# Patient Record
Sex: Female | Born: 1935 | Race: Black or African American | Hispanic: No | Smoking: Former smoker
Health system: Southern US, Community
[De-identification: ages and names within clinical notes are randomized; demographics above are authoritative.]

## PROBLEM LIST (undated history)

## (undated) DIAGNOSIS — D509 Iron deficiency anemia, unspecified: Secondary | ICD-10-CM

## (undated) DIAGNOSIS — N189 Chronic kidney disease, unspecified: Secondary | ICD-10-CM

## (undated) DIAGNOSIS — K269 Duodenal ulcer, unspecified as acute or chronic, without hemorrhage or perforation: Secondary | ICD-10-CM

## (undated) DIAGNOSIS — E785 Hyperlipidemia, unspecified: Secondary | ICD-10-CM

## (undated) DIAGNOSIS — M1990 Unspecified osteoarthritis, unspecified site: Secondary | ICD-10-CM

## (undated) DIAGNOSIS — G309 Alzheimer's disease, unspecified: Secondary | ICD-10-CM

## (undated) DIAGNOSIS — I10 Essential (primary) hypertension: Secondary | ICD-10-CM

## (undated) DIAGNOSIS — S2239XA Fracture of one rib, unspecified side, initial encounter for closed fracture: Secondary | ICD-10-CM

## (undated) DIAGNOSIS — F0280 Dementia in other diseases classified elsewhere without behavioral disturbance: Secondary | ICD-10-CM

## (undated) DIAGNOSIS — K219 Gastro-esophageal reflux disease without esophagitis: Secondary | ICD-10-CM

## (undated) DIAGNOSIS — K222 Esophageal obstruction: Secondary | ICD-10-CM

## (undated) DIAGNOSIS — N201 Calculus of ureter: Secondary | ICD-10-CM

## (undated) HISTORY — PX: DILATION AND CURETTAGE OF UTERUS: SHX78

## (undated) HISTORY — PX: ABDOMINAL SURGERY: SHX537

## (undated) HISTORY — PX: ORTHOPEDIC SURGERY: SHX850

---

## 2001-03-20 ENCOUNTER — Ambulatory Visit (HOSPITAL_COMMUNITY): Admission: RE | Admit: 2001-03-20 | Discharge: 2001-03-20 | Payer: Self-pay | Admitting: Internal Medicine

## 2001-03-20 ENCOUNTER — Encounter: Payer: Self-pay | Admitting: Internal Medicine

## 2002-04-03 ENCOUNTER — Ambulatory Visit (HOSPITAL_COMMUNITY): Admission: RE | Admit: 2002-04-03 | Discharge: 2002-04-03 | Payer: Self-pay | Admitting: Internal Medicine

## 2002-04-03 ENCOUNTER — Encounter: Payer: Self-pay | Admitting: Internal Medicine

## 2003-05-10 ENCOUNTER — Ambulatory Visit (HOSPITAL_COMMUNITY): Admission: RE | Admit: 2003-05-10 | Discharge: 2003-05-10 | Payer: Self-pay | Admitting: Internal Medicine

## 2003-05-10 ENCOUNTER — Encounter: Payer: Self-pay | Admitting: Internal Medicine

## 2004-07-14 ENCOUNTER — Ambulatory Visit (HOSPITAL_COMMUNITY): Admission: RE | Admit: 2004-07-14 | Discharge: 2004-07-14 | Payer: Self-pay | Admitting: Internal Medicine

## 2005-08-03 ENCOUNTER — Ambulatory Visit (HOSPITAL_COMMUNITY): Admission: RE | Admit: 2005-08-03 | Discharge: 2005-08-03 | Payer: Self-pay | Admitting: Internal Medicine

## 2006-09-10 ENCOUNTER — Ambulatory Visit (HOSPITAL_COMMUNITY): Admission: RE | Admit: 2006-09-10 | Discharge: 2006-09-10 | Payer: Self-pay | Admitting: Internal Medicine

## 2007-10-13 ENCOUNTER — Ambulatory Visit (HOSPITAL_COMMUNITY): Admission: RE | Admit: 2007-10-13 | Discharge: 2007-10-13 | Payer: Self-pay | Admitting: Internal Medicine

## 2008-12-31 ENCOUNTER — Ambulatory Visit (HOSPITAL_COMMUNITY): Admission: RE | Admit: 2008-12-31 | Discharge: 2008-12-31 | Payer: Self-pay | Admitting: Internal Medicine

## 2009-12-19 ENCOUNTER — Ambulatory Visit (HOSPITAL_COMMUNITY): Admission: RE | Admit: 2009-12-19 | Discharge: 2009-12-19 | Payer: Self-pay | Admitting: Internal Medicine

## 2010-10-19 ENCOUNTER — Ambulatory Visit (HOSPITAL_COMMUNITY): Admission: RE | Admit: 2010-10-19 | Discharge: 2010-10-19 | Payer: Self-pay | Admitting: Internal Medicine

## 2012-01-20 ENCOUNTER — Encounter (HOSPITAL_COMMUNITY): Payer: Self-pay | Admitting: *Deleted

## 2012-01-20 ENCOUNTER — Other Ambulatory Visit: Payer: Self-pay

## 2012-01-20 ENCOUNTER — Emergency Department (HOSPITAL_COMMUNITY): Payer: Medicare Other

## 2012-01-20 ENCOUNTER — Inpatient Hospital Stay (HOSPITAL_COMMUNITY)
Admission: EM | Admit: 2012-01-20 | Discharge: 2012-01-23 | DRG: 690 | Disposition: A | Discharge status: Home Health | Payer: Medicare Other | Attending: Internal Medicine | Admitting: Internal Medicine

## 2012-01-20 DIAGNOSIS — Z9181 History of falling: Secondary | ICD-10-CM

## 2012-01-20 DIAGNOSIS — N390 Urinary tract infection, site not specified: Secondary | ICD-10-CM | POA: Diagnosis not present

## 2012-01-20 DIAGNOSIS — G309 Alzheimer's disease, unspecified: Secondary | ICD-10-CM | POA: Diagnosis present

## 2012-01-20 DIAGNOSIS — E7800 Pure hypercholesterolemia, unspecified: Secondary | ICD-10-CM | POA: Diagnosis present

## 2012-01-20 DIAGNOSIS — A498 Other bacterial infections of unspecified site: Secondary | ICD-10-CM | POA: Diagnosis present

## 2012-01-20 DIAGNOSIS — N289 Disorder of kidney and ureter, unspecified: Secondary | ICD-10-CM | POA: Diagnosis present

## 2012-01-20 DIAGNOSIS — H5316 Psychophysical visual disturbances: Secondary | ICD-10-CM | POA: Diagnosis not present

## 2012-01-20 DIAGNOSIS — F0280 Dementia in other diseases classified elsewhere without behavioral disturbance: Secondary | ICD-10-CM | POA: Diagnosis present

## 2012-01-20 DIAGNOSIS — Z79899 Other long term (current) drug therapy: Secondary | ICD-10-CM | POA: Diagnosis not present

## 2012-01-20 DIAGNOSIS — M1710 Unilateral primary osteoarthritis, unspecified knee: Secondary | ICD-10-CM | POA: Diagnosis present

## 2012-01-20 DIAGNOSIS — R410 Disorientation, unspecified: Secondary | ICD-10-CM

## 2012-01-20 DIAGNOSIS — IMO0002 Reserved for concepts with insufficient information to code with codable children: Secondary | ICD-10-CM | POA: Diagnosis present

## 2012-01-20 DIAGNOSIS — D649 Anemia, unspecified: Secondary | ICD-10-CM | POA: Diagnosis present

## 2012-01-20 DIAGNOSIS — I440 Atrioventricular block, first degree: Secondary | ICD-10-CM | POA: Diagnosis present

## 2012-01-20 DIAGNOSIS — W19XXXA Unspecified fall, initial encounter: Secondary | ICD-10-CM

## 2012-01-20 DIAGNOSIS — Y92009 Unspecified place in unspecified non-institutional (private) residence as the place of occurrence of the external cause: Secondary | ICD-10-CM

## 2012-01-20 DIAGNOSIS — E876 Hypokalemia: Secondary | ICD-10-CM | POA: Diagnosis present

## 2012-01-20 DIAGNOSIS — F17200 Nicotine dependence, unspecified, uncomplicated: Secondary | ICD-10-CM | POA: Diagnosis present

## 2012-01-20 DIAGNOSIS — I498 Other specified cardiac arrhythmias: Secondary | ICD-10-CM | POA: Diagnosis present

## 2012-01-20 DIAGNOSIS — I10 Essential (primary) hypertension: Secondary | ICD-10-CM | POA: Diagnosis not present

## 2012-01-20 DIAGNOSIS — M25869 Other specified joint disorders, unspecified knee: Secondary | ICD-10-CM | POA: Diagnosis not present

## 2012-01-20 DIAGNOSIS — Z8673 Personal history of transient ischemic attack (TIA), and cerebral infarction without residual deficits: Secondary | ICD-10-CM

## 2012-01-20 DIAGNOSIS — R404 Transient alteration of awareness: Secondary | ICD-10-CM | POA: Diagnosis present

## 2012-01-20 DIAGNOSIS — E860 Dehydration: Secondary | ICD-10-CM | POA: Diagnosis present

## 2012-01-20 DIAGNOSIS — M6282 Rhabdomyolysis: Secondary | ICD-10-CM | POA: Diagnosis present

## 2012-01-20 DIAGNOSIS — X58XXXA Exposure to other specified factors, initial encounter: Secondary | ICD-10-CM

## 2012-01-20 DIAGNOSIS — R4182 Altered mental status, unspecified: Secondary | ICD-10-CM | POA: Diagnosis not present

## 2012-01-20 DIAGNOSIS — M25569 Pain in unspecified knee: Secondary | ICD-10-CM | POA: Diagnosis not present

## 2012-01-20 LAB — CBC
HCT: 32.5 % — ABNORMAL LOW (ref 36.0–46.0)
Hemoglobin: 11 g/dL — ABNORMAL LOW (ref 12.0–15.0)
MCH: 28.1 pg (ref 26.0–34.0)
MCHC: 33.8 g/dL (ref 30.0–36.0)
MCV: 83.1 fL (ref 78.0–100.0)
Platelets: 294 K/uL (ref 150–400)
RBC: 3.91 MIL/uL (ref 3.87–5.11)
RDW: 13.1 % (ref 11.5–15.5)
WBC: 10.3 10*3/uL (ref 4.0–10.5)

## 2012-01-20 LAB — URINALYSIS, ROUTINE W REFLEX MICROSCOPIC
Bilirubin Urine: NEGATIVE
Glucose, UA: NEGATIVE mg/dL
Ketones, ur: NEGATIVE mg/dL
Nitrite: POSITIVE — AB
Protein, ur: NEGATIVE mg/dL
Specific Gravity, Urine: 1.02 (ref 1.005–1.030)
Urobilinogen, UA: 0.2 mg/dL (ref 0.0–1.0)
pH: 6 (ref 5.0–8.0)

## 2012-01-20 LAB — URINE MICROSCOPIC-ADD ON

## 2012-01-20 LAB — BASIC METABOLIC PANEL
BUN: 18 mg/dL (ref 6–23)
CO2: 29 mEq/L (ref 19–32)
Chloride: 97 mEq/L (ref 96–112)
GFR calc non Af Amer: 41 mL/min — ABNORMAL LOW (ref >90)
Glucose, Bld: 112 mg/dL — ABNORMAL HIGH (ref 70–99)

## 2012-01-20 LAB — BASIC METABOLIC PANEL WITH GFR
Calcium: 10.4 mg/dL (ref 8.4–10.5)
Creatinine, Ser: 1.2 mg/dL — ABNORMAL HIGH (ref 0.50–1.10)
GFR calc Af Amer: 48 mL/min — ABNORMAL LOW (ref >90)
Potassium: 3.1 meq/L — ABNORMAL LOW (ref 3.5–5.1)
Sodium: 135 meq/L (ref 135–145)

## 2012-01-20 LAB — CK: Total CK: 1789 U/L — ABNORMAL HIGH (ref 7–177)

## 2012-01-20 LAB — TROPONIN I: Troponin I: <0.3 ng/mL (ref <0.30)

## 2012-01-20 MED ORDER — POTASSIUM CHLORIDE CRYS ER 20 MEQ PO TBCR
40.0000 meq | EXTENDED_RELEASE_TABLET | Freq: Once | ORAL | Status: AC
  Administered 2012-01-20: 40 meq via ORAL
  Filled 2012-01-20: qty 2

## 2012-01-20 MED ORDER — SODIUM CHLORIDE 0.9 % IV BOLUS (SEPSIS)
500.0000 mL | Freq: Once | INTRAVENOUS | Status: AC
  Administered 2012-01-20: 500 mL via INTRAVENOUS

## 2012-01-20 MED ORDER — DILTIAZEM HCL ER BEADS 300 MG PO CP24
300.0000 mg | ORAL_CAPSULE | Freq: Every day | ORAL | Status: DC
  Administered 2012-01-20 – 2012-01-23 (×4): 300 mg via ORAL
  Filled 2012-01-20 (×7): qty 1

## 2012-01-20 MED ORDER — HYDROCHLOROTHIAZIDE 25 MG PO TABS
25.0000 mg | ORAL_TABLET | Freq: Every day | ORAL | Status: DC
  Administered 2012-01-20: 25 mg via ORAL
  Filled 2012-01-20: qty 1

## 2012-01-20 MED ORDER — ATENOLOL 25 MG PO TABS
50.0000 mg | ORAL_TABLET | Freq: Every day | ORAL | Status: DC
  Administered 2012-01-20 – 2012-01-23 (×3): 50 mg via ORAL
  Filled 2012-01-20 (×3): qty 2
  Filled 2012-01-20 (×2): qty 1

## 2012-01-20 MED ORDER — SODIUM CHLORIDE 0.9 % IV SOLN
Freq: Once | INTRAVENOUS | Status: AC
  Administered 2012-01-20: 15:00:00 via INTRAVENOUS

## 2012-01-20 MED ORDER — SODIUM CHLORIDE 0.9 % IV SOLN
INTRAVENOUS | Status: DC
  Administered 2012-01-20: 21:00:00 via INTRAVENOUS

## 2012-01-20 MED ORDER — LOSARTAN POTASSIUM-HCTZ 100-25 MG PO TABS: 1.0000 | ORAL_TABLET | Freq: Every day | ORAL | Status: DC

## 2012-01-20 MED ORDER — POTASSIUM CHLORIDE CRYS ER 20 MEQ PO TBCR: 20.0000 meq | EXTENDED_RELEASE_TABLET | Freq: Every day | ORAL | Status: DC

## 2012-01-20 MED ORDER — SIMVASTATIN 20 MG PO TABS
20.0000 mg | ORAL_TABLET | Freq: Every day | ORAL | Status: DC
  Administered 2012-01-20 – 2012-01-23 (×4): 20 mg via ORAL
  Filled 2012-01-20 (×7): qty 1

## 2012-01-20 MED ORDER — CIPROFLOXACIN IN D5W 400 MG/200ML IV SOLN
400.0000 mg | Freq: Two times a day (BID) | INTRAVENOUS | Status: DC
  Administered 2012-01-21 – 2012-01-22 (×5): 400 mg via INTRAVENOUS
  Filled 2012-01-20 (×8): qty 200

## 2012-01-20 MED ORDER — SODIUM CHLORIDE 0.9 % IV SOLN
INTRAVENOUS | Status: DC
  Administered 2012-01-21 – 2012-01-22 (×2): via INTRAVENOUS

## 2012-01-20 MED ORDER — ACETAMINOPHEN 500 MG PO TABS: 500.0000 mg | ORAL_TABLET | ORAL | Status: DC | PRN

## 2012-01-20 MED ORDER — POTASSIUM CHLORIDE CRYS ER 20 MEQ PO TBCR
20.0000 meq | EXTENDED_RELEASE_TABLET | Freq: Two times a day (BID) | ORAL | Status: DC
  Administered 2012-01-20 – 2012-01-21 (×3): 20 meq via ORAL
  Filled 2012-01-20 (×3): qty 1

## 2012-01-20 MED ORDER — DEXTROSE 5 % IV SOLN
1.0000 g | Freq: Once | INTRAVENOUS | Status: AC
  Administered 2012-01-20: 1 g via INTRAVENOUS
  Filled 2012-01-20 (×2): qty 10

## 2012-01-20 MED ORDER — LOSARTAN POTASSIUM 50 MG PO TABS
100.0000 mg | ORAL_TABLET | Freq: Every day | ORAL | Status: DC
Start: 2012-01-20 — End: 2012-01-23
  Administered 2012-01-20 – 2012-01-23 (×4): 100 mg via ORAL
  Filled 2012-01-20 (×5): qty 2
  Filled 2012-01-20 (×3): qty 1
  Filled 2012-01-20 (×2): qty 2

## 2012-01-20 NOTE — ED Notes (Signed)
Pt was brought in my family today due to recent falls. Pt was found in the floor yesterday and this morning. Pt is A/O x 4 and very pleasant. NAD. Pt denies pain from falls.

## 2012-01-20 NOTE — ED Notes (Signed)
Confused for the past 2 days, found in the floor at  Home by daughter, unable to get up, family reports periods of confusion becoming more frequent, pain in left ankle

## 2012-01-20 NOTE — ED Notes (Signed)
Took pt to bathroom, but she was unable to void at this time.

## 2012-01-20 NOTE — ED Provider Notes (Signed)
History     CSN: [id number]  Arrival date & time 01/20/12  1156   First MD Initiated Contact with Patient 01/20/12 1305      Chief Complaint  Patient presents with  . Altered Mental Status    (Consider location/radiation/quality/duration/timing/severity/associated sxs/prior treatment) HPI Comments: Level 5 caveat due to altered mental status and poor memory.  She apparently does not have a history of dementia that has been diagnosed, although I suspect it. The patient does live alone in a retirement community with the patient's daughter visiting her quite frequently. Daughter reports the patient has fallen twice in the last week 24 hours. She was found on the floor by the daughter today. She denied chest pain, shortness of breath or syncope. She attributes this to simply turning too fast and losing her balance. She reports sometimes she has trouble standing up because she has some chronic issues with her right knee. It had been recommended to her to have a knee replacement which she chose not to do. She reports that she does not have a good plan as far as reaching out to family or to obtain help when she does fall. Patient's daughter also reports that the patient has had a change in her mental status with decrease in her memory and apparently having visual hallucinations last night. Patient reports that she saw people and she reports that she about them into the house although the hallucinations did not speak to her and she did not have any auditory hallucinations. Eventually apparently the hallucinations stopped. Patient has never had hallucinations in the past. Reportedly no new medications. She does smoke cigarettes but does not use alcohol or drugs. Patient denies headache, blurred vision, focal numbness or weakness. She denies any recent nausea vomiting or diarrhea. She does have a cough at baseline due to her smoking, but has not changed recently. Her primary care physician is Dr. Jamie Brookes.  Patient is a 78 y.o. female presenting with altered mental status. The history is provided by the patient and a relative.  Altered Mental Status    Past Medical History  Diagnosis Date  . Hypertension     Past Surgical History  Procedure Date  . Orthopedic surgery   . Abdominal surgery     History reviewed. No pertinent family history.  History  Substance Use Topics  . Smoking status: Current Everyday Smoker -- 1.0 packs/day  . Smokeless tobacco: Not on file  . Alcohol Use: No    OB History    Grav Para Term Preterm Abortions TAB SAB Ect Mult Living                  Review of Systems  Unable to perform ROS: Mental status change  Psychiatric/Behavioral: Positive for altered mental status.    Allergies  Review of patient's allergies indicates no known allergies.  Home Medications  No current outpatient prescriptions on file.  BP 130/55  Pulse 85  Temp(Src) 99.1 F (37.3 C) (Oral)  Resp 18  SpO2 94%  Physical Exam  Nursing note and vitals reviewed. Constitutional: She appears well-developed and well-nourished.  HENT:  Head: Normocephalic and atraumatic.  Eyes: Pupils are equal, round, and reactive to light. No scleral icterus.  Neck: Normal range of motion. Neck supple.  Cardiovascular: Normal rate and regular rhythm.   Pulmonary/Chest: Effort normal and breath sounds normal. No respiratory distress. She has no wheezes.  Abdominal: Soft. There is no tenderness.  Musculoskeletal: She exhibits tenderness. She exhibits no  edema.       Right knee: She exhibits no effusion and no ecchymosis. tenderness found.  Neurological: She is alert. She has normal strength. No sensory deficit. GCS eye subscore is 4. GCS verbal subscore is 4. GCS motor subscore is 6.       No facial droop, no arm drift.  Pt is not oriented to time, was not correct in her current address.  Could not recall president's name.    Skin: Skin is warm and dry.  Psychiatric: Her mood  appears not anxious. Her affect is not angry and not labile. Her speech is not rapid and/or pressured, not delayed and not slurred. She is actively hallucinating. She does not express inappropriate judgment. She does not exhibit a depressed mood. She is communicative. She exhibits abnormal recent memory and abnormal remote memory.    ED Course  Procedures (including critical care time)  Labs Reviewed  URINALYSIS, ROUTINE W REFLEX MICROSCOPIC - Abnormal; Notable for the following:    APPearance HAZY (*)    Hgb urine dipstick TRACE (*)    Nitrite POSITIVE (*)    Leukocytes, UA SMALL (*)    All other components within normal limits  CBC - Abnormal; Notable for the following:    Hemoglobin 11.0 (*)    HCT 32.5 (*)    All other components within normal limits  BASIC METABOLIC PANEL - Abnormal; Notable for the following:    Potassium 3.1 (*)    Glucose, Bld 112 (*)    Creatinine, Ser 1.20 (*)    GFR calc non Af Amer 41 (*)    GFR calc Af Amer 48 (*)    All other components within normal limits  CK - Abnormal; Notable for the following:    Total CK 1789 (*)    All other components within normal limits  URINE MICROSCOPIC-ADD ON - Abnormal; Notable for the following:    Squamous Epithelial / LPF FEW (*)    Bacteria, UA MANY (*)    All other components within normal limits  TROPONIN I  URINE CULTURE   Dg Knee 2 Views Right  01/20/2012  *RADIOLOGY REPORT*  Clinical Data: Medial knee pain and swelling.  No known injury.  RIGHT KNEE - 1-2 VIEW  Comparison: None.  Findings: Tried compartmental degenerative changes are present. There is marked joint space narrowing and osteophyte formation of the medial compartment.  Small joint effusion is present.  There is no evidence for acute fracture.  There is dense atherosclerotic calcification of the popliteal artery.  IMPRESSION:  1.  Marked degenerative changes. 2. No evidence for acute  abnormality.  Original Report Authenticated By: Patterson Hammersmith, M.D.   Ct Head Wo Contrast  01/20/2012  *RADIOLOGY REPORT*  Clinical Data: Altered mental status.  Hypertension  CT HEAD WITHOUT CONTRAST  Technique:  Contiguous axial images were obtained from the base of the skull through the vertex without contrast.  Comparison: None.  Findings: Age appropriate atrophy.  Minimal chronic microvascular ischemia in the white matter.  No acute infarct.  Negative for hemorrhage or mass.  No acute bony abnormality.  IMPRESSION: No acute intracranial abnormality.  Original Report Authenticated By: Camelia Phenes, M.D.   Dg Chest Portable 1 View  01/20/2012  *RADIOLOGY REPORT*  Clinical Data: Altered mental status  PORTABLE CHEST - 1 VIEW  Comparison: 12/19/2009  Findings: Stable borderline cardiomegaly.  Lungs are clear.  No effusion.  Degenerative changes of bilateral shoulders.  IMPRESSION:  1.  No acute disease.  Original Report Authenticated By: Thora Lance III, M.D.     1. Urinary tract infection   2. Delirium   3. Fall     sats are 98% and normal  ECG at time 13:28 shows sinus rhythm at a rate of 59 with first degree heart block. PACs noted. Criteria for a left anterior fascicular block is present. Left axis is deviated. Nonspecific T waves are seen an anterior and lateral leads. No prior EKGs available.  MDM  Pt with change in mental status, falling.  No fever, normal BP, not toxic appearing, but lives alone, some safety issues present.  Possibly pt is dehydration, UTI, or worsening dementia and not associated with visual only hallucinations.  Not present currently.  Will likely need admission for further work up.       5:40 PM Dr. Sudie Bailey reached, will see pt in the ED.  Gavin Pound. Madasyn Heath, MD 01/20/12 1610

## 2012-01-20 NOTE — ED Notes (Signed)
Patient requesting something to eat. States she hasn't eaten since yesterday. Brought patient meal tray. Patient sitting up in bed eating with family assistance at this time.

## 2012-01-20 NOTE — ED Notes (Signed)
Informed pt of needed urine sample.  Pt unable to void at this time. 

## 2012-01-20 NOTE — ED Notes (Signed)
Order in for bed request. Asked EMD to verify med or tele bed. EMD stated Dr. Sudie Bailey was seeing pt in ED and would decide then.

## 2012-01-20 NOTE — H&P (Signed)
NAMEJAYDAN, Barbara [name]                ACCOUNT NO.:  [id number]  MEDICAL RECORD NO.:  [id number]  LOCATION:  A330                          FACILITY:  [facility]  PHYSICIAN:  Mila Homer. Sudie Bailey, M.D.DATE OF BIRTH:  1928-09-12  DATE OF ADMISSION:  01/20/2012 DATE OF DISCHARGE:  LH                             HISTORY & PHYSICAL   78 year old woman, who is an inhabitant of a local retirement community who was found on the floor by her daughter today.  She was too weak to get up.  She has also had a problem with her right knee with arthritic changes in it.  The patient has fallen twice in the last 24 hours.  Her daughter, who was present initially during the patient's admission, said the patient has had a change in mental status with decreased memory and visual hallucinations last night.  Apparently, she has saw people in the house who were not there.  She had no auditory hallucinations with this.  She has had no chest pain, syncope, chest pressure, vertigo, etc.  Her local doctor is Dr. Ouida Sills.  CURRENT MEDICATIONS: 1. Atenolol 60 mg daily. 2. Atorvastatin 20 mg daily. 3. Diltiazem long-acting 300 mg daily. 4. Losartan/HCTZ 100/25 daily. 5. Kay-Ciel 20 mEq daily.  MEDICAL PROBLEMS:  Include benign essential hypertension and hypercholesterolemia.  There is also a  history of old CVA and abdominal surgery, but we do not have that record right here.  She is a daily smoker of currently a pack a day.  She has not used alcohol.  She is divorced.  EXAM:  This is a pleasant elderly woman who is supine in bed.  Family members were with her including her grandson.  Her daughter just left. VITAL SIGNS:  Temp is 98.4, pulse 67, respiratory rate 20, blood pressure 139/68.  She appeared to be oriented and alert.  Speech appeared to be normal.  Mucous membranes of mouth are moist. SKIN:  Skin turgor is normal. HEART:  Regular rhythm.  Rate of about 70 on my exam. LUNGS:  Lungs appear clear  throughout.  She is moving air well. ABDOMEN:  Soft without organomegaly, mass, or tenderness. EXTREMITIES:  No edema of the ankles.  Her admission blood work showed a BMP with a BUN 18, creatinine 1.2 low, potassium 3.1.  Her total CK was 1789.  Hemoglobin 11.  UA showed positive nitrite, small leukocytes, too numerous to count WBCs, 11-20 RBCs per HPF and many bacteria.  X-ray of the chest showed stable borderline cardiomegaly with no acute disease.  X-ray of the right knee showed marked degenerative changes with joint space narrowing and osteophyte formation in the medial compartment.  She also had dense atherosclerotic calcification of the popliteal artery.  CT of the head showed age-appropriate atrophy and minimal chronic microvascular ischemia in the white matter.  Twelve lead EKG showed sinus bradycardia, first-degree AV block.  Her PR interval was 240 milliseconds.  The ventricular rate 59.  Urine culture is pending.  ADMISSION DIAGNOSES: 1. Dehydration. 2. Urinary tract infection. 3. Visual hallucinations probably secondary to delirium. 4. Rhabdomyolysis. 5. Benign essential hypertension. 6. Hypokalemia.  PLAN:  Treatment will include normal saline at 100  mL an hour.  We will recheck a CPK, BMP, CBC tomorrow.  I am increasing her Kay-Ciel from 20 mEq daily to 20 mEq b.i.d.  She has already received 40 mEq of potassium in the ER.  She will continue losartan 100 mg daily with HCTZ 25 mg daily, Diltiazem long-acting 300 mg daily, simvastatin 20 mg daily rather than atorvastatin.  I will start her on Cipro IV.  Urine culture results are pending.     Mila Homer. Sudie Bailey, M.D.     SDK/MEDQ  D:  01/20/2012  T:  01/20/2012  Job:  [id number]

## 2012-01-21 DIAGNOSIS — N390 Urinary tract infection, site not specified: Secondary | ICD-10-CM | POA: Diagnosis not present

## 2012-01-21 LAB — BASIC METABOLIC PANEL
BUN: 13 mg/dL (ref 6–23)
CO2: 25 mEq/L (ref 19–32)
Calcium: 9.2 mg/dL (ref 8.4–10.5)
Chloride: 106 mEq/L (ref 96–112)
Creatinine, Ser: 0.99 mg/dL (ref 0.50–1.10)
Glucose, Bld: 96 mg/dL (ref 70–99)

## 2012-01-21 LAB — CBC
HCT: 30.3 % — ABNORMAL LOW (ref 36.0–46.0)
Hemoglobin: 10 g/dL — ABNORMAL LOW (ref 12.0–15.0)
MCH: 28 pg (ref 26.0–34.0)
MCV: 84.9 fL (ref 78.0–100.0)
RBC: 3.57 MIL/uL — ABNORMAL LOW (ref 3.87–5.11)

## 2012-01-21 MED ORDER — TEMAZEPAM 15 MG PO CAPS: 15.0000 mg | ORAL_CAPSULE | Freq: Once | ORAL | Status: DC

## 2012-01-21 MED ORDER — CIPROFLOXACIN IN D5W 400 MG/200ML IV SOLN
INTRAVENOUS | Status: AC
  Filled 2012-01-21: qty 200

## 2012-01-21 MED ORDER — ZOLPIDEM TARTRATE 5 MG PO TABS
5.0000 mg | ORAL_TABLET | Freq: Every evening | ORAL | Status: DC | PRN
  Administered 2012-01-21: 5 mg via ORAL
  Filled 2012-01-21: qty 1

## 2012-01-21 NOTE — Progress Notes (Signed)
NAMECHARLSEY, Barbara [name]                ACCOUNT NO.:  [id number]  MEDICAL RECORD NO.:  [id number]  LOCATION:  A330                          FACILITY:  [facility]  PHYSICIAN:  Kingsley Callander. Ouida Sills, MD       DATE OF BIRTH:  June 08, 1928  DATE OF PROCEDURE:  01/21/2012 DATE OF DISCHARGE:                                PROGRESS NOTE   Ms. Barbara [name] was admitted last night after presenting with altered mental status and urinary tract infection.  She had falls at home.  She has had difficulty getting up.  She had scraped her buttocks, sliding across the floor.  CK was elevated at 1789.  She was hypokalemic at 3.1.  White count was 10.3.  Her urinalysis revealed too numerous to count WBCs.  A CT scan of the brain revealed no evidence of acute infarct.  Chest x-ray revealed no infiltrate.  An x-ray of her right knee revealed no fracture.  PHYSICAL EXAMINATION:  GENERAL:  This morning, she is alert and comfortable but remains a little confused. VITAL SIGNS:  Temperature is 97.9, pulse 51, respirations 20, blood pressure 111/63, oxygen saturation 98%. LUNGS:  Clear. HEART:  Bradycardic with no murmurs. ABDOMEN:  Soft and nontender. EXTREMITIES:  Buttocks reveal abraded skin on both lower buttocks. Knees reveal bilateral degenerative changes.  No peripheral edema.  IMPRESSION/PLAN: 1. Urinary tract infection.  Urine culture is pending.  Continue IV     Cipro and ceftriaxone. 2. Hypertension.  Continue atenolol, diltiazem, and losartan.  Then we     will hold hydrochlorothiazide. 3. Hypokalemia.  Serum potassium has improved from 3.1-3.8. 4. Renal insufficiency likely secondary to her volume status.  BUN and     creatinine have dropped from 18 and 1.2 to 13 and 0.99 with     hydration. 5. Elevated CK secondary to falls and muscle trauma.  Her CK has     dropped from 1789 at 1306.     Kingsley Callander. Ouida Sills, MD     ROF/MEDQ  D:  01/21/2012  T:  01/21/2012  Job:  [id number]

## 2012-01-21 NOTE — Evaluation (Signed)
Physical Therapy Evaluation Patient Details Name: Barbara [name] MRN: [id number] DOB: 07-Jan-1928 Today's Date: 01/21/2012  Problem List: There is no problem list on file for this patient.   Past Medical History:  Past Medical History  Diagnosis Date  . Hypertension     High Cholesterol   Past Surgical History:  Past Surgical History  Procedure Date  . Orthopedic surgery   . Abdominal surgery     PT Assessment/Plan/Recommendation PT Assessment Clinical Impression Statement: very cooperative pt who is still somewhat confused but able to follow all directions...she appears to be at functional baseline.Marland KitchenMarland KitchenI am recommendiing that she use a walker due to DJD of R knee and has some instability when turning.Marland KitchenMarland KitchenAlso recommend a HHPT consult at d/c for a home safety eval         PT Recommendation/Assessment: All further PT needs can be met in the next venue of care PT Problem List: Decreased knowledge of use of DME;Decreased knowledge of precautions PT Therapy Diagnosis : Difficulty walking PT Recommendation Follow Up Recommendations: Home health PT Equipment Recommended: Rolling walker with 5" wheels PT Goals     PT Evaluation Precautions/Restrictions  Precautions Precautions: Fall Required Braces or Orthoses: No Restrictions Weight Bearing Restrictions: No Prior Functioning  Home Living Lives With: Alone Receives Help From: Family Type of Home: Apartment Home Layout: One level Home Access: Level entry Bathroom Toilet: Standard Home Adaptive Equipment: Straight cane Prior Function Level of Independence: Independent with basic ADLs;Independent with homemaking with ambulation;Independent with transfers;Independent with gait;Requires assistive device for independence Driving: No Vocation: Retired Financial risk analyst Arousal/Alertness: Awake/alert Overall Cognitive Status: Appears within functional limits for tasks assessed Orientation Level: Oriented to person;Disoriented to  place;Disoriented to time;Disoriented to situation Sensation/Coordination Sensation Light Touch: Appears Intact Stereognosis: Not tested Hot/Cold: Not tested Proprioception: Appears Intact Coordination Gross Motor Movements are Fluid and Coordinated: Yes Fine Motor Movements are Fluid and Coordinated: Not tested Extremity Assessment RUE Assessment RUE Assessment: Within Functional Limits LUE Assessment LUE Assessment: Within Functional Limits RLE Assessment RLE Assessment: Within Functional Limits (knee extension is -10 deg due to OA) LLE Assessment LLE Assessment: Within Functional Limits Mobility (including Balance) Bed Mobility Bed Mobility: Yes Supine to Sit: 7: Independent;HOB flat Sit to Supine: 7: Independent;HOB flat Transfers Transfers: Yes Sit to Stand: 6: Modified independent (Device/Increase time) Stand to Sit: 6: Modified independent (Device/Increase time) Ambulation/Gait Ambulation/Gait: Yes Ambulation/Gait Assistance: 5: Supervision Ambulation/Gait Assistance Details (indicate cue type and reason): stable gait pattern with striaght cane until she made one turn and then had a slight imbalance--she was then instructed in gait with a walker and very stable Ambulation Distance (Feet): 120 Feet (80' with walker) Gait Pattern: Decreased hip/knee flexion - right;Trunk flexed Stairs: No Wheelchair Mobility Wheelchair Mobility: No  Posture/Postural Control Posture/Postural Control: No significant limitations Balance Balance Assessed: No Exercise    End of Session PT - End of Session Equipment Utilized During Treatment: Gait belt Activity Tolerance: Patient tolerated treatment well Patient left: in bed;with call bell in reach;with bed alarm set;with family/visitor present General Behavior During Session: [facility] for tasks performed Cognition: [facility] for tasks performed  Konrad Penta 01/21/2012, 1:57 PM

## 2012-01-22 DIAGNOSIS — N390 Urinary tract infection, site not specified: Secondary | ICD-10-CM | POA: Diagnosis not present

## 2012-01-22 LAB — URINE CULTURE
Colony Count: >=100000
Culture  Setup Time: 201303032101

## 2012-01-22 LAB — BASIC METABOLIC PANEL
CO2: 25 mEq/L (ref 19–32)
Chloride: 107 mEq/L (ref 96–112)
GFR calc Af Amer: 60 mL/min — ABNORMAL LOW (ref >90)
Sodium: 137 mEq/L (ref 135–145)

## 2012-01-22 MED ORDER — ENOXAPARIN SODIUM 40 MG/0.4ML ~~LOC~~ SOLN
40.0000 mg | SUBCUTANEOUS | Status: DC
  Administered 2012-01-22: 40 mg via SUBCUTANEOUS
  Filled 2012-01-22: qty 0.4

## 2012-01-22 NOTE — Clinical Documentation Improvement (Signed)
CHANGE MENTAL STATUS DOCUMENTATION CLARIFICATION   THIS DOCUMENT IS NOT A PERMANENT PART OF THE MEDICAL RECORD  TO RESPOND TO THE THIS QUERY, FOLLOW THE INSTRUCTIONS BELOW:  1. If needed, update documentation for the patient's encounter via the notes activity.  2. Access this query again and click edit on the In [employer].  3. After updating, or not, click F2 to complete all highlighted (required) fields concerning your review. Select "additional documentation in the medical record" OR "no additional documentation provided".  4. Click Sign note button.  5. The deficiency will fall out of your In Basket *Please let us know if you are not able to complete this workflow by phone or e-mail (listed below).         01/22/12  Dear Dr. Ouida Sills Marton Redwood  In an effort to better capture your patient's severity of illness, reflect appropriate length of stay and utilization of resources, a review of the patient medical record has revealed the following indicators.    Based on your clinical judgment, please clarify and document in a progress note and/or discharge summary the clinical condition associated with the following supporting information:  In responding to this query please exercise your independent judgment.  The fact that a query is asked, does not imply that any particular answer is desired or expected.  Possible Clinical Conditions? Encephalopathy (describe type if known)         Metabolic         Toxic Drug induced confusion/delirium Acute confusion Acute delirium Acute exacerbation of known dementia (indicate type) New diagnosis of Dementia, Alzheimer's, cerebral atherosclerosis Other Condition__________________ Cannot Clinically Determine   Supporting Information:  Clinical Information:  Risk Factors: Advanced age Diagnosis of delirium by ED provider Dehydration UTI  Signs & Symptoms: Not oriented to time, current address, & current events visual  hallucinations abnormal recent & remote memory  Diagnostics: Lab: Positive urinalysis  Radiology: xrays negative  Treatment: IV NS @ 35ml/h IV Cipro 400mg  q12h EKG Xrays  Reviewed: additional documentation in the medical record  Alzheimer's dementia found in D/C summary.  Thank You,  Harless Litten RN, MSN Clinical Documentation Specialist: Office# [phone] [facility] Health Information Management [place]

## 2012-01-22 NOTE — Progress Notes (Signed)
We received another request for a PT eval.  Pt was actually seen yesterday (01-21-12) and eval revealed pt to very close to prior functional level.  It was deemed that all further PT needs could be attended to by HHPT (home safety eval).

## 2012-01-22 NOTE — Progress Notes (Signed)
Barbara [name], Barbara [name]                ACCOUNT NO.:  [id number]  MEDICAL RECORD NO.:  [id number]  LOCATION:  A330                          FACILITY:  [facility]  PHYSICIAN:  Kingsley Callander. Ouida Sills, MD       DATE OF BIRTH:  1928/01/03  DATE OF PROCEDURE:  01/22/2012 DATE OF DISCHARGE:                                PROGRESS NOTE   Barbara [name] has had no fever overnight.  She denies any urinary tract symptoms.  She feels some stiffness and soreness in her joints today.  PHYSICAL EXAMINATION:  VITAL SIGNS:  Temperature is 98.4, pulse 53, respirations 18, blood pressure 115/60, oxygen saturation 95%. LUNGS:  Clear. HEART:  Regular. ABDOMEN:  Soft and nontender. NEURO:  She remains confused.  She is unable to tell me where she is. She is unable to tell me the year.  She states the month is October. She cannot name the president.  She knows the number of nickels in a dollar but not in a dollar 35.  IMPRESSION/PLAN: 1. Urinary tract infection.  Urine culture is pending.  Continue IV     Cipro. 2. Hypokalemia.  Serum potassium is normalized to 3.9 with     supplementation.  Her diuretic has been stopped. 3. Confusion.  It appears she likely has an underlying dementia     exacerbated by her acute illness.  CT scan reveals no evidence of     acute stroke.  She has been evaluated by physical therapy.  She     will have a vitamin B12 level and a TSH obtained.  The CT scan of     the head reveals chronic small vessel changes without sign of     stroke. 4. Elevated CK secondary to muscle trauma.  Continue hydration.  Renal     function is stable.  BUN and creatinine are 9 and 1.0. 5. Hypertension, well controlled on losartan, diltiazem, and atenolol.     Kingsley Callander. Ouida Sills, MD     ROF/MEDQ  D:  01/22/2012  T:  01/22/2012  Job:  [id number]

## 2012-01-23 DIAGNOSIS — N390 Urinary tract infection, site not specified: Secondary | ICD-10-CM | POA: Diagnosis not present

## 2012-01-23 LAB — MRSA CULTURE

## 2012-01-23 LAB — TSH: TSH: 2.041 u[IU]/mL (ref 0.350–4.500)

## 2012-01-23 LAB — VITAMIN B12: Vitamin B-12: 256 pg/mL (ref 211–911)

## 2012-01-23 MED ORDER — CIPROFLOXACIN HCL 500 MG PO TABS: 500.0000 mg | ORAL_TABLET | Freq: Two times a day (BID) | ORAL | Status: AC

## 2012-01-23 NOTE — Discharge Instructions (Signed)

## 2012-01-23 NOTE — Progress Notes (Signed)
Patient discharged home with family.  Home Health will be following.  Instructions given on new antibiotics.  Follow up appointment in place for 1 week.  Patient and family verbalize understanding of discharge instructions.

## 2012-01-23 NOTE — Discharge Summary (Signed)
NAMECHRISTIE, [name]                ACCOUNT NO.:  [id number]  MEDICAL RECORD NO.:  [id number]  LOCATION:  A330                          FACILITY:  [facility]  PHYSICIAN:  Kingsley Callander. Ouida Sills, MD       DATE OF BIRTH:  [birth year]  DATE OF ADMISSION:  01/20/2012 DATE OF DISCHARGE:  LH                              DISCHARGE SUMMARY   DISCHARGE DIAGNOSES: 1. Escherichia coli urinary tract infection. 2. Dementia 3. Hypokalemia. 4. Hypertension. 5. Normocytic anemia. 6. Buttock abrasions. 7. Elevated CK secondary to muscle trauma.  DISCHARGE MEDICATIONS: 1. Cipro 500 mg b.i.d. for 7 more days. 2. Hyzaar 100/25 mg daily. 3. Atenolol 50 mg daily. 4. Diltiazem CD 300 mg daily. 5. Simvastatin 20 mg at bedtime.  HOSPITAL COURSE:  This patient is an 78 year old female who presented with fever and confusion.  She was found to have evidence of UTI on her urinalysis.  She was started on IV Cipro.  Her urine culture revealed greater than 100,000 E. coli.  Fever resolved.  White count remained normal.  She was hypokalemic initially at 3.1, but normalized to 3.9 with supplementation.  BUN and creatinine were mildly elevated initially at 18 and 1.20, but normalized to 9 and 1.0 with IV fluids.  She had increased confusion.  She underwent a CT scan of the brain which revealed small vessel changes only.  She has signs now of dementia which is likely of the Alzheimer's type.  A TSH and B12 level are pending.  She was improved and stable for discharge on the 5th.  She will have home health followup and will be seen in my office in 1 week.  Antibiotic therapy was modified to the oral route.  She developed abrasions to her buttocks after sliding on the floor at home.  She will keep pressure off these and use Polysporin as needed twice a day.  She did have an elevated CK of 1789 initially which dropped to 1306.  Hypertension has been satisfactorily controlled on her usual regimen.  X-rays of her right knee  revealed degenerative changes, but no fracture. Chest x-ray revealed clear lung fields.  CONDITION AT DISCHARGE:  Much improved.     Kingsley Callander. Ouida Sills, MD     ROF/MEDQ  D:  01/23/2012  T:  01/23/2012  Job:  [id number]

## 2012-01-24 NOTE — Progress Notes (Signed)
CARE MANAGEMENT NOTE 01/24/2012  Patient:  Barbara [name], Barbara [name]   Account Number:  [id number]  Date Initiated:  01/23/2012  Documentation initiated by:  Anibal Henderson  Subjective/Objective Assessment:   Admitted with  UTI, and dementia,  with AMS.  Pt is from home. Lives alone in apartment for the elderly, with assistance from family.     Action/Plan:   Pt will benefit from [facility]. Daughter choses [facility].   Anticipated DC Date:  01/23/2012   Anticipated DC Plan:  HOME W HOME HEALTH SERVICES      DC Planning Services  CM consult      [facility] Choice  HOME HEALTH   Choice offered to / List presented to:  C-4 Adult Children        HH arranged  HH-1 RN  HH-2 PT      [facility] agency  [facility]   Status of service:  Completed, signed off Medicare Important Message given?   (If response is "NO", the following Medicare IM given date fields will be blank) Date Medicare IM given:   Date Additional Medicare IM given:    Discharge Disposition:  HOME W HOME HEALTH SERVICES  Per [facility] Regulation:    Comments:  01/23/12 1300 Anibal Henderson RN

## 2012-01-24 NOTE — Progress Notes (Signed)
Utilization review completed.  

## 2012-01-29 DIAGNOSIS — F0280 Dementia in other diseases classified elsewhere without behavioral disturbance: Secondary | ICD-10-CM | POA: Diagnosis not present

## 2012-01-29 DIAGNOSIS — G309 Alzheimer's disease, unspecified: Secondary | ICD-10-CM | POA: Diagnosis not present

## 2012-01-29 DIAGNOSIS — N390 Urinary tract infection, site not specified: Secondary | ICD-10-CM | POA: Diagnosis not present

## 2012-01-31 DIAGNOSIS — M159 Polyosteoarthritis, unspecified: Secondary | ICD-10-CM | POA: Diagnosis not present

## 2012-01-31 DIAGNOSIS — D649 Anemia, unspecified: Secondary | ICD-10-CM | POA: Diagnosis not present

## 2012-01-31 DIAGNOSIS — N390 Urinary tract infection, site not specified: Secondary | ICD-10-CM | POA: Diagnosis not present

## 2012-01-31 DIAGNOSIS — F0390 Unspecified dementia without behavioral disturbance: Secondary | ICD-10-CM | POA: Diagnosis not present

## 2012-01-31 DIAGNOSIS — A498 Other bacterial infections of unspecified site: Secondary | ICD-10-CM | POA: Diagnosis not present

## 2012-01-31 DIAGNOSIS — F0281 Dementia in other diseases classified elsewhere with behavioral disturbance: Secondary | ICD-10-CM | POA: Diagnosis not present

## 2012-01-31 DIAGNOSIS — I10 Essential (primary) hypertension: Secondary | ICD-10-CM | POA: Diagnosis not present

## 2012-02-04 DIAGNOSIS — D649 Anemia, unspecified: Secondary | ICD-10-CM | POA: Diagnosis not present

## 2012-02-04 DIAGNOSIS — N390 Urinary tract infection, site not specified: Secondary | ICD-10-CM | POA: Diagnosis not present

## 2012-02-04 DIAGNOSIS — M159 Polyosteoarthritis, unspecified: Secondary | ICD-10-CM | POA: Diagnosis not present

## 2012-02-04 DIAGNOSIS — I10 Essential (primary) hypertension: Secondary | ICD-10-CM | POA: Diagnosis not present

## 2012-02-04 DIAGNOSIS — F0390 Unspecified dementia without behavioral disturbance: Secondary | ICD-10-CM | POA: Diagnosis not present

## 2012-02-04 DIAGNOSIS — A498 Other bacterial infections of unspecified site: Secondary | ICD-10-CM | POA: Diagnosis not present

## 2012-02-05 DIAGNOSIS — I10 Essential (primary) hypertension: Secondary | ICD-10-CM | POA: Diagnosis not present

## 2012-02-05 DIAGNOSIS — M159 Polyosteoarthritis, unspecified: Secondary | ICD-10-CM | POA: Diagnosis not present

## 2012-02-05 DIAGNOSIS — F0390 Unspecified dementia without behavioral disturbance: Secondary | ICD-10-CM | POA: Diagnosis not present

## 2012-02-05 DIAGNOSIS — A498 Other bacterial infections of unspecified site: Secondary | ICD-10-CM | POA: Diagnosis not present

## 2012-02-05 DIAGNOSIS — N390 Urinary tract infection, site not specified: Secondary | ICD-10-CM | POA: Diagnosis not present

## 2012-02-05 DIAGNOSIS — D649 Anemia, unspecified: Secondary | ICD-10-CM | POA: Diagnosis not present

## 2012-02-07 DIAGNOSIS — A498 Other bacterial infections of unspecified site: Secondary | ICD-10-CM | POA: Diagnosis not present

## 2012-02-07 DIAGNOSIS — F0390 Unspecified dementia without behavioral disturbance: Secondary | ICD-10-CM | POA: Diagnosis not present

## 2012-02-07 DIAGNOSIS — M159 Polyosteoarthritis, unspecified: Secondary | ICD-10-CM | POA: Diagnosis not present

## 2012-02-07 DIAGNOSIS — I10 Essential (primary) hypertension: Secondary | ICD-10-CM | POA: Diagnosis not present

## 2012-02-07 DIAGNOSIS — N390 Urinary tract infection, site not specified: Secondary | ICD-10-CM | POA: Diagnosis not present

## 2012-02-07 DIAGNOSIS — D649 Anemia, unspecified: Secondary | ICD-10-CM | POA: Diagnosis not present

## 2012-02-08 DIAGNOSIS — D649 Anemia, unspecified: Secondary | ICD-10-CM | POA: Diagnosis not present

## 2012-02-08 DIAGNOSIS — A498 Other bacterial infections of unspecified site: Secondary | ICD-10-CM | POA: Diagnosis not present

## 2012-02-08 DIAGNOSIS — N390 Urinary tract infection, site not specified: Secondary | ICD-10-CM | POA: Diagnosis not present

## 2012-02-08 DIAGNOSIS — I10 Essential (primary) hypertension: Secondary | ICD-10-CM | POA: Diagnosis not present

## 2012-02-08 DIAGNOSIS — F0390 Unspecified dementia without behavioral disturbance: Secondary | ICD-10-CM | POA: Diagnosis not present

## 2012-02-08 DIAGNOSIS — M159 Polyosteoarthritis, unspecified: Secondary | ICD-10-CM | POA: Diagnosis not present

## 2012-02-11 DIAGNOSIS — F0390 Unspecified dementia without behavioral disturbance: Secondary | ICD-10-CM | POA: Diagnosis not present

## 2012-02-11 DIAGNOSIS — A498 Other bacterial infections of unspecified site: Secondary | ICD-10-CM | POA: Diagnosis not present

## 2012-02-11 DIAGNOSIS — M159 Polyosteoarthritis, unspecified: Secondary | ICD-10-CM | POA: Diagnosis not present

## 2012-02-11 DIAGNOSIS — N390 Urinary tract infection, site not specified: Secondary | ICD-10-CM | POA: Diagnosis not present

## 2012-02-11 DIAGNOSIS — D649 Anemia, unspecified: Secondary | ICD-10-CM | POA: Diagnosis not present

## 2012-02-11 DIAGNOSIS — I10 Essential (primary) hypertension: Secondary | ICD-10-CM | POA: Diagnosis not present

## 2012-02-13 DIAGNOSIS — I10 Essential (primary) hypertension: Secondary | ICD-10-CM | POA: Diagnosis not present

## 2012-02-13 DIAGNOSIS — N390 Urinary tract infection, site not specified: Secondary | ICD-10-CM | POA: Diagnosis not present

## 2012-02-13 DIAGNOSIS — F0390 Unspecified dementia without behavioral disturbance: Secondary | ICD-10-CM | POA: Diagnosis not present

## 2012-02-13 DIAGNOSIS — A498 Other bacterial infections of unspecified site: Secondary | ICD-10-CM | POA: Diagnosis not present

## 2012-02-13 DIAGNOSIS — M159 Polyosteoarthritis, unspecified: Secondary | ICD-10-CM | POA: Diagnosis not present

## 2012-02-13 DIAGNOSIS — D649 Anemia, unspecified: Secondary | ICD-10-CM | POA: Diagnosis not present

## 2012-05-05 DIAGNOSIS — G309 Alzheimer's disease, unspecified: Secondary | ICD-10-CM | POA: Diagnosis not present

## 2012-05-05 DIAGNOSIS — I10 Essential (primary) hypertension: Secondary | ICD-10-CM | POA: Diagnosis not present

## 2012-05-05 DIAGNOSIS — F0280 Dementia in other diseases classified elsewhere without behavioral disturbance: Secondary | ICD-10-CM | POA: Diagnosis not present

## 2012-08-21 DIAGNOSIS — F0280 Dementia in other diseases classified elsewhere without behavioral disturbance: Secondary | ICD-10-CM | POA: Diagnosis not present

## 2012-08-21 DIAGNOSIS — I10 Essential (primary) hypertension: Secondary | ICD-10-CM | POA: Diagnosis not present

## 2012-08-21 DIAGNOSIS — G309 Alzheimer's disease, unspecified: Secondary | ICD-10-CM | POA: Diagnosis not present

## 2012-11-26 DIAGNOSIS — F0280 Dementia in other diseases classified elsewhere without behavioral disturbance: Secondary | ICD-10-CM | POA: Diagnosis not present

## 2012-11-26 DIAGNOSIS — G309 Alzheimer's disease, unspecified: Secondary | ICD-10-CM | POA: Diagnosis not present

## 2012-11-26 DIAGNOSIS — I10 Essential (primary) hypertension: Secondary | ICD-10-CM | POA: Diagnosis not present

## 2013-02-23 DIAGNOSIS — I10 Essential (primary) hypertension: Secondary | ICD-10-CM | POA: Diagnosis not present

## 2013-02-23 DIAGNOSIS — F0280 Dementia in other diseases classified elsewhere without behavioral disturbance: Secondary | ICD-10-CM | POA: Diagnosis not present

## 2013-02-23 DIAGNOSIS — Z79899 Other long term (current) drug therapy: Secondary | ICD-10-CM | POA: Diagnosis not present

## 2013-02-23 DIAGNOSIS — M1990 Unspecified osteoarthritis, unspecified site: Secondary | ICD-10-CM | POA: Diagnosis not present

## 2013-02-27 DIAGNOSIS — I10 Essential (primary) hypertension: Secondary | ICD-10-CM | POA: Diagnosis not present

## 2013-02-27 DIAGNOSIS — F0280 Dementia in other diseases classified elsewhere without behavioral disturbance: Secondary | ICD-10-CM | POA: Diagnosis not present

## 2013-02-27 DIAGNOSIS — M1990 Unspecified osteoarthritis, unspecified site: Secondary | ICD-10-CM | POA: Diagnosis not present

## 2013-02-27 DIAGNOSIS — Z79899 Other long term (current) drug therapy: Secondary | ICD-10-CM | POA: Diagnosis not present

## 2013-03-05 DIAGNOSIS — I10 Essential (primary) hypertension: Secondary | ICD-10-CM | POA: Diagnosis not present

## 2013-03-05 DIAGNOSIS — G309 Alzheimer's disease, unspecified: Secondary | ICD-10-CM | POA: Diagnosis not present

## 2013-09-25 DIAGNOSIS — I10 Essential (primary) hypertension: Secondary | ICD-10-CM | POA: Diagnosis not present

## 2013-09-25 DIAGNOSIS — Z23 Encounter for immunization: Secondary | ICD-10-CM | POA: Diagnosis not present

## 2013-09-25 DIAGNOSIS — M1990 Unspecified osteoarthritis, unspecified site: Secondary | ICD-10-CM | POA: Diagnosis not present

## 2014-05-28 DIAGNOSIS — I10 Essential (primary) hypertension: Secondary | ICD-10-CM | POA: Diagnosis not present

## 2014-05-28 DIAGNOSIS — G309 Alzheimer's disease, unspecified: Secondary | ICD-10-CM | POA: Diagnosis not present

## 2014-05-28 DIAGNOSIS — F0280 Dementia in other diseases classified elsewhere without behavioral disturbance: Secondary | ICD-10-CM | POA: Diagnosis not present

## 2014-10-01 DIAGNOSIS — Z79899 Other long term (current) drug therapy: Secondary | ICD-10-CM | POA: Diagnosis not present

## 2014-10-01 DIAGNOSIS — I10 Essential (primary) hypertension: Secondary | ICD-10-CM | POA: Diagnosis not present

## 2014-10-01 DIAGNOSIS — E785 Hyperlipidemia, unspecified: Secondary | ICD-10-CM | POA: Diagnosis not present

## 2014-10-01 DIAGNOSIS — G309 Alzheimer's disease, unspecified: Secondary | ICD-10-CM | POA: Diagnosis not present

## 2014-10-01 DIAGNOSIS — M1990 Unspecified osteoarthritis, unspecified site: Secondary | ICD-10-CM | POA: Diagnosis not present

## 2014-10-08 DIAGNOSIS — Z23 Encounter for immunization: Secondary | ICD-10-CM | POA: Diagnosis not present

## 2014-10-08 DIAGNOSIS — I10 Essential (primary) hypertension: Secondary | ICD-10-CM | POA: Diagnosis not present

## 2014-10-08 DIAGNOSIS — G309 Alzheimer's disease, unspecified: Secondary | ICD-10-CM | POA: Diagnosis not present

## 2015-02-04 DIAGNOSIS — N183 Chronic kidney disease, stage 3 (moderate): Secondary | ICD-10-CM | POA: Diagnosis not present

## 2015-02-04 DIAGNOSIS — Z23 Encounter for immunization: Secondary | ICD-10-CM | POA: Diagnosis not present

## 2015-02-04 DIAGNOSIS — I10 Essential (primary) hypertension: Secondary | ICD-10-CM | POA: Diagnosis not present

## 2015-02-04 DIAGNOSIS — G309 Alzheimer's disease, unspecified: Secondary | ICD-10-CM | POA: Diagnosis not present

## 2015-04-19 DIAGNOSIS — H2513 Age-related nuclear cataract, bilateral: Secondary | ICD-10-CM | POA: Diagnosis not present

## 2015-04-19 DIAGNOSIS — H25013 Cortical age-related cataract, bilateral: Secondary | ICD-10-CM | POA: Diagnosis not present

## 2015-04-19 DIAGNOSIS — H2511 Age-related nuclear cataract, right eye: Secondary | ICD-10-CM | POA: Diagnosis not present

## 2015-04-19 DIAGNOSIS — H25011 Cortical age-related cataract, right eye: Secondary | ICD-10-CM | POA: Diagnosis not present

## 2015-05-12 NOTE — Patient Instructions (Signed)
Your procedure is scheduled on: 05/17/2015  Report to [facility] at   1000  AM.  Call this number if you have problems the morning of surgery: [phone]   Do not eat food or drink liquids :After Midnight.      Take these medicines the morning of surgery with A SIP OF WATER: atenolol, diltiazem, aricept, hyzaar   Do not wear jewelry, make-up or nail polish.  Do not wear lotions, powders, or perfumes.   Do not shave 48 hours prior to surgery.  Do not bring valuables to the hospital.  Contacts, dentures or bridgework may not be worn into surgery.  Leave suitcase in the car. After surgery it may be brought to your room.  For patients admitted to the hospital, checkout time is 11:00 AM the day of discharge.   Patients discharged the day of surgery will not be allowed to drive home.  :     Please read over the following fact sheets that you were given: Coughing and Deep Breathing, Surgical Site Infection Prevention, Anesthesia Post-op Instructions and Care and Recovery After Surgery    Cataract A cataract is a clouding of the lens of the eye. When a lens becomes cloudy, vision is reduced based on the degree and nature of the clouding. Many cataracts reduce vision to some degree. Some cataracts make people more near-sighted as they develop. Other cataracts increase glare. Cataracts that are ignored and become worse can sometimes look white. The white color can be seen through the pupil. CAUSES   Aging. However, cataracts may occur at any age, even in newborns.   Certain drugs.   Trauma to the eye.   Certain diseases such as diabetes.   Specific eye diseases such as chronic inflammation inside the eye or a sudden attack of a rare form of glaucoma.   Inherited or acquired medical problems.  SYMPTOMS   Gradual, progressive drop in vision in the affected eye.   Severe, rapid visual loss. This most often happens when trauma is the cause.  DIAGNOSIS  To detect a cataract, an eye doctor  examines the lens. Cataracts are best diagnosed with an exam of the eyes with the pupils enlarged (dilated) by drops.  TREATMENT  For an early cataract, vision may improve by using different eyeglasses or stronger lighting. If that does not help your vision, surgery is the only effective treatment. A cataract needs to be surgically removed when vision loss interferes with your everyday activities, such as driving, reading, or watching TV. A cataract may also have to be removed if it prevents examination or treatment of another eye problem. Surgery removes the cloudy lens and usually replaces it with a substitute lens (intraocular lens, IOL).  At a time when both you and your doctor agree, the cataract will be surgically removed. If you have cataracts in both eyes, only one is usually removed at a time. This allows the operated eye to heal and be out of danger from any possible problems after surgery (such as infection or poor wound healing). In rare cases, a cataract may be doing damage to your eye. In these cases, your caregiver may advise surgical removal right away. The vast majority of people who have cataract surgery have better vision afterward. HOME CARE INSTRUCTIONS  If you are not planning surgery, you may be asked to do the following:  Use different eyeglasses.   Use stronger or brighter lighting.   Ask your eye doctor about reducing your medicine dose  or changing medicines if it is thought that a medicine caused your cataract. Changing medicines does not make the cataract go away on its own.   Become familiar with your surroundings. Poor vision can lead to injury. Avoid bumping into things on the affected side. You are at a higher risk for tripping or falling.   Exercise extreme care when driving or operating machinery.   Wear sunglasses if you are sensitive to bright light or experiencing problems with glare.  SEEK IMMEDIATE MEDICAL CARE IF:   You have a worsening or sudden vision  loss.   You notice redness, swelling, or increasing pain in the eye.   You have a fever.  Document Released: 11/05/2005 Document Revised: 10/25/2011 Document Reviewed: 06/29/2011 [facility] Patient Information 2012 [place].PATIENT INSTRUCTIONS POST-ANESTHESIA  IMMEDIATELY FOLLOWING SURGERY:  Do not drive or operate machinery for the first twenty four hours after surgery.  Do not make any important decisions for twenty four hours after surgery or while taking narcotic pain medications or sedatives.  If you develop intractable nausea and vomiting or a severe headache please notify your doctor immediately.  FOLLOW-UP:  Please make an appointment with your surgeon as instructed. You do not need to follow up with anesthesia unless specifically instructed to do so.  WOUND CARE INSTRUCTIONS (if applicable):  Keep a dry clean dressing on the anesthesia/puncture wound site if there is drainage.  Once the wound has quit draining you may leave it open to air.  Generally you should leave the bandage intact for twenty four hours unless there is drainage.  If the epidural site drains for more than 36-48 hours please call the anesthesia department.  QUESTIONS?:  Please feel free to call your physician or the hospital operator if you have any questions, and they will be happy to assist you.

## 2015-05-13 ENCOUNTER — Other Ambulatory Visit: Payer: Self-pay

## 2015-05-13 ENCOUNTER — Encounter (HOSPITAL_COMMUNITY): Payer: Self-pay

## 2015-05-13 ENCOUNTER — Encounter (HOSPITAL_COMMUNITY)
Admission: RE | Admit: 2015-05-13 | Discharge: 2015-05-13 | Disposition: A | Discharge status: Home / Self Care | Payer: Medicare Other | Source: Ambulatory Visit | Attending: Ophthalmology | Admitting: Ophthalmology

## 2015-05-13 DIAGNOSIS — Z01818 Encounter for other preprocedural examination: Secondary | ICD-10-CM | POA: Insufficient documentation

## 2015-05-13 DIAGNOSIS — H2511 Age-related nuclear cataract, right eye: Secondary | ICD-10-CM | POA: Diagnosis not present

## 2015-05-13 HISTORY — DX: Alzheimer's disease, unspecified: G30.9

## 2015-05-13 HISTORY — DX: Dementia in other diseases classified elsewhere, unspecified severity, without behavioral disturbance, psychotic disturbance, mood disturbance, and anxiety: F02.80

## 2015-05-13 HISTORY — DX: Unspecified osteoarthritis, unspecified site: M19.90

## 2015-05-13 LAB — CBC WITH DIFFERENTIAL/PLATELET
BASOS ABS: 0 10*3/uL (ref 0.0–0.1)
Basophils Relative: 0 % (ref 0–1)
EOS ABS: 0 10*3/uL (ref 0.0–0.7)
EOS PCT: 0 % (ref 0–5)
HEMATOCRIT: 28.4 % — AB (ref 36.0–46.0)
Hemoglobin: 8.9 g/dL — ABNORMAL LOW (ref 12.0–15.0)
Lymphocytes Relative: 23 % (ref 12–46)
Lymphs Abs: 1.7 10*3/uL (ref 0.7–4.0)
MCH: 26.1 pg (ref 26.0–34.0)
MCHC: 31.3 g/dL (ref 30.0–36.0)
MCV: 83.3 fL (ref 78.0–100.0)
Monocytes Absolute: 0.5 10*3/uL (ref 0.1–1.0)
Monocytes Relative: 7 % (ref 3–12)
Neutro Abs: 5 10*3/uL (ref 1.7–7.7)
Neutrophils Relative %: 70 % (ref 43–77)
PLATELETS: 421 10*3/uL — AB (ref 150–400)
RBC: 3.41 MIL/uL — ABNORMAL LOW (ref 3.87–5.11)
RDW: 14.4 % (ref 11.5–15.5)
WBC: 7.3 10*3/uL (ref 4.0–10.5)

## 2015-05-13 LAB — BASIC METABOLIC PANEL
ANION GAP: 8 (ref 5–15)
BUN: 14 mg/dL (ref 6–20)
CO2: 26 mmol/L (ref 22–32)
CREATININE: 1.03 mg/dL — AB (ref 0.44–1.00)
Calcium: 9.5 mg/dL (ref 8.9–10.3)
Chloride: 104 mmol/L (ref 101–111)
GFR calc non Af Amer: 49 mL/min — ABNORMAL LOW (ref >60)
GFR, EST AFRICAN AMERICAN: 56 mL/min — AB (ref >60)
Glucose, Bld: 108 mg/dL — ABNORMAL HIGH (ref 65–99)
Potassium: 4.2 mmol/L (ref 3.5–5.1)
SODIUM: 138 mmol/L (ref 135–145)

## 2015-05-13 NOTE — Pre-Procedure Instructions (Signed)
Patient given information to sign up for my chart at home. 

## 2015-05-16 NOTE — Pre-Procedure Instructions (Signed)
Dr Patsey Berthold aware of H&H, no orders given.

## 2015-05-17 ENCOUNTER — Encounter (HOSPITAL_COMMUNITY)
Admission: RE | Disposition: A | Discharge status: Home / Self Care | Payer: Self-pay | Source: Ambulatory Visit | Attending: Ophthalmology

## 2015-05-17 ENCOUNTER — Ambulatory Visit (HOSPITAL_COMMUNITY)
Admission: RE | Admit: 2015-05-17 | Discharge: 2015-05-17 | Disposition: A | Discharge status: Home / Self Care | Payer: Medicare Other | Source: Ambulatory Visit | Attending: Ophthalmology | Admitting: Ophthalmology

## 2015-05-17 ENCOUNTER — Ambulatory Visit (HOSPITAL_COMMUNITY): Payer: Medicare Other | Admitting: Anesthesiology

## 2015-05-17 ENCOUNTER — Encounter (HOSPITAL_COMMUNITY): Payer: Self-pay | Admitting: *Deleted

## 2015-05-17 DIAGNOSIS — F17200 Nicotine dependence, unspecified, uncomplicated: Secondary | ICD-10-CM | POA: Diagnosis not present

## 2015-05-17 DIAGNOSIS — Z79899 Other long term (current) drug therapy: Secondary | ICD-10-CM | POA: Diagnosis not present

## 2015-05-17 DIAGNOSIS — H2511 Age-related nuclear cataract, right eye: Secondary | ICD-10-CM | POA: Diagnosis not present

## 2015-05-17 DIAGNOSIS — I10 Essential (primary) hypertension: Secondary | ICD-10-CM | POA: Diagnosis not present

## 2015-05-17 DIAGNOSIS — G309 Alzheimer's disease, unspecified: Secondary | ICD-10-CM | POA: Insufficient documentation

## 2015-05-17 DIAGNOSIS — H5703 Miosis: Secondary | ICD-10-CM | POA: Diagnosis not present

## 2015-05-17 DIAGNOSIS — H25011 Cortical age-related cataract, right eye: Secondary | ICD-10-CM | POA: Diagnosis not present

## 2015-05-17 DIAGNOSIS — F0280 Dementia in other diseases classified elsewhere without behavioral disturbance: Secondary | ICD-10-CM | POA: Insufficient documentation

## 2015-05-17 DIAGNOSIS — H259 Unspecified age-related cataract: Secondary | ICD-10-CM | POA: Diagnosis not present

## 2015-05-17 DIAGNOSIS — M1990 Unspecified osteoarthritis, unspecified site: Secondary | ICD-10-CM | POA: Insufficient documentation

## 2015-05-17 DIAGNOSIS — Z7982 Long term (current) use of aspirin: Secondary | ICD-10-CM | POA: Insufficient documentation

## 2015-05-17 HISTORY — PX: CATARACT EXTRACTION W/PHACO: SHX586

## 2015-05-17 SURGERY — PHACOEMULSIFICATION, CATARACT, WITH IOL INSERTION
Anesthesia: Monitor Anesthesia Care | Site: Eye | Laterality: Right

## 2015-05-17 MED ORDER — FENTANYL CITRATE (PF) 100 MCG/2ML IJ SOLN: 25.0000 ug | INTRAMUSCULAR | Status: DC | PRN

## 2015-05-17 MED ORDER — TETRACAINE HCL 0.5 % OP SOLN
1.0000 [drp] | OPHTHALMIC | Status: AC
  Administered 2015-05-17 (×3): 1 [drp] via OPHTHALMIC

## 2015-05-17 MED ORDER — ONDANSETRON HCL 4 MG/2ML IJ SOLN: 4.0000 mg | Freq: Once | INTRAMUSCULAR | Status: DC | PRN

## 2015-05-17 MED ORDER — MIDAZOLAM HCL 2 MG/2ML IJ SOLN
INTRAMUSCULAR | Status: AC
  Filled 2015-05-17: qty 2

## 2015-05-17 MED ORDER — MIDAZOLAM HCL 2 MG/2ML IJ SOLN
1.0000 mg | INTRAMUSCULAR | Status: DC | PRN
  Administered 2015-05-17 (×2): 1 mg via INTRAVENOUS

## 2015-05-17 MED ORDER — BSS IO SOLN
INTRAOCULAR | Status: AC
  Filled 2015-05-17: qty 4

## 2015-05-17 MED ORDER — BSS IO SOLN
INTRAOCULAR | Status: DC | PRN
  Administered 2015-05-17: 15 mL

## 2015-05-17 MED ORDER — LACTATED RINGERS IV SOLN
INTRAVENOUS | Status: DC
  Administered 2015-05-17: 10:00:00 via INTRAVENOUS

## 2015-05-17 MED ORDER — BSS IO SOLN
INTRAOCULAR | Status: DC | PRN
  Administered 2015-05-17: 500 mL

## 2015-05-17 MED ORDER — CYCLOPENTOLATE-PHENYLEPHRINE 0.2-1 % OP SOLN
1.0000 [drp] | OPHTHALMIC | Status: AC
  Administered 2015-05-17 (×3): 1 [drp] via OPHTHALMIC

## 2015-05-17 MED ORDER — FENTANYL CITRATE (PF) 100 MCG/2ML IJ SOLN
25.0000 ug | Freq: Once | INTRAMUSCULAR | Status: AC
  Administered 2015-05-17: 25 ug via INTRAVENOUS

## 2015-05-17 MED ORDER — FENTANYL CITRATE (PF) 100 MCG/2ML IJ SOLN
INTRAMUSCULAR | Status: AC
  Filled 2015-05-17: qty 2

## 2015-05-17 MED ORDER — PHENYLEPHRINE HCL 2.5 % OP SOLN
1.0000 [drp] | OPHTHALMIC | Status: AC
  Administered 2015-05-17 (×3): 1 [drp] via OPHTHALMIC

## 2015-05-17 MED ORDER — PROVISC 10 MG/ML IO SOLN
INTRAOCULAR | Status: DC | PRN
  Administered 2015-05-17: 0.85 mL via INTRAOCULAR

## 2015-05-17 MED ORDER — LIDOCAINE HCL (PF) 1 % IJ SOLN
INTRAMUSCULAR | Status: AC
  Filled 2015-05-17: qty 2

## 2015-05-17 MED ORDER — TETRACAINE 0.5 % OP SOLN OPTIME - NO CHARGE
OPHTHALMIC | Status: DC | PRN
  Administered 2015-05-17: 2 [drp] via OPHTHALMIC

## 2015-05-17 MED ORDER — EPINEPHRINE HCL 1 MG/ML IJ SOLN
INTRAMUSCULAR | Status: AC
  Filled 2015-05-17: qty 1

## 2015-05-17 MED ORDER — LIDOCAINE HCL (PF) 1 % IJ SOLN
INTRAMUSCULAR | Status: DC | PRN
  Administered 2015-05-17: .75 mL

## 2015-05-17 MED ORDER — KETOROLAC TROMETHAMINE 0.5 % OP SOLN
1.0000 [drp] | OPHTHALMIC | Status: AC
  Administered 2015-05-17 (×3): 1 [drp] via OPHTHALMIC

## 2015-05-17 SURGICAL SUPPLY — 11 items
CLOTH BEACON ORANGE TIMEOUT ST (SAFETY) ×2 IMPLANT
EYE SHIELD UNIVERSAL CLEAR (GAUZE/BANDAGES/DRESSINGS) ×2 IMPLANT
GLOVE BIO SURGEON STRL SZ 6.5 (GLOVE) ×1 IMPLANT
GLOVE BIO SURGEONS STRL SZ 6.5 (GLOVE) ×1
GLOVE EXAM NITRILE MD LF STRL (GLOVE) ×2 IMPLANT
LENS ALC ACRYL/TECN (Ophthalmic Related) ×3 IMPLANT
PAD ARMBOARD 7.5X6 YLW CONV (MISCELLANEOUS) ×2 IMPLANT
RING MALYGIN (MISCELLANEOUS) ×2 IMPLANT
TAPE SURG TRANSPORE 1 IN (GAUZE/BANDAGES/DRESSINGS) IMPLANT
TAPE SURGICAL TRANSPORE 1 IN (GAUZE/BANDAGES/DRESSINGS) ×2
WATER STERILE IRR 250ML POUR (IV SOLUTION) ×2 IMPLANT

## 2015-05-17 NOTE — Anesthesia Procedure Notes (Signed)
Procedure Name: MAC Date/Time: 05/17/2015 10:21 AM Performed by: Andree Elk, Naven Giambalvo A Pre-anesthesia Checklist: Patient identified, Timeout performed, Emergency Drugs available, Suction available and Patient being monitored Oxygen Delivery Method: Nasal cannula

## 2015-05-17 NOTE — Anesthesia Preprocedure Evaluation (Signed)
Anesthesia Evaluation  Patient identified by MRN, date of birth, ID band Patient awake    Reviewed: Allergy & Precautions, NPO status , Patient's Chart, lab work & pertinent test results, reviewed documented beta blocker date and time   Airway Mallampati: II  TM Distance: >3 FB     Dental  (+) Edentulous Upper, Edentulous Lower   Pulmonary Current Smoker,  breath sounds clear to auscultation        Cardiovascular hypertension, Pt. on medications and Pt. on home beta blockers Rhythm:Regular Rate:Normal     Neuro/Psych PSYCHIATRIC DISORDERS (Alzheimers)    GI/Hepatic negative GI ROS,   Endo/Other    Renal/GU      Musculoskeletal   Abdominal   Peds  Hematology   Anesthesia Other Findings   Reproductive/Obstetrics                             Anesthesia Physical Anesthesia Plan  ASA: III  Anesthesia Plan: MAC   Post-op Pain Management:    Induction: Intravenous  Airway Management Planned: Nasal Cannula  Additional Equipment:   Intra-op Plan:   Post-operative Plan:   Informed Consent: I have reviewed the patients History and Physical, chart, labs and discussed the procedure including the risks, benefits and alternatives for the proposed anesthesia with the patient or authorized representative who has indicated his/her understanding and acceptance.     Plan Discussed with:   Anesthesia Plan Comments:         Anesthesia Quick Evaluation

## 2015-05-17 NOTE — Op Note (Signed)
Patient brought to the operating room and prepped and draped in the usual manner.  Lid speculum inserted in right eye.  Stab incision made at the twelve o'clock position.  Provisc instilled in the anterior chamber.   A 2.4 mm. Stab incision was made temporally. Due to a small pupil, a Malugyn Ring was inserted.  An anterior capsulotomy was done with a bent 25 gauge needle.  The nucleus was hydrodissected.  The Phaco tip was inserted in the anterior chamber and the nucleus was emulsified.  CDE was 8.33.  The cortical material was then removed with the I and A tip.  Posterior capsule was the polished.  The anterior chamber was deepened with Provisc.  A 21.0 Diopter Alcon SN60WF IOL was then inserted in the capsular bag. The Malugyn Ring was removed. Provisc was then removed with the I and A tip.  The wound was then hydrated.  Patient sent to the Recovery Room in good condition with follow up in my office.  Preoperative Diagnosis:  Nuclear Cataract OD Postoperative Diagnosis:  Same Procedure name: Kelman Phacoemulsification OD with IOL

## 2015-05-17 NOTE — H&P (Signed)
The patient was re examined and there is no change in the patients condition since the original H and P. 

## 2015-05-17 NOTE — Discharge Instructions (Signed)
Barbara [name]  05/17/2015           [place] Instructions [place] [address]      1. Avoid closing eyes tightly. One often closes the eye tightly when laughing, talking, sneezing, coughing or if they feel irritated. At these times, you should be careful not to close your eyes tightly.  2. Instill eye drops as instructed. To instill drops in your eye, open it, look up and have someone gently pull the lower lid down and instill a couple of drops inside the lower lid.  3. Do not touch upper lid.  4. Take Advil or Tylenol for pain.  5. You may use either eye for near work, such as reading or sewing and you may watch television.  6. You may have your hair done at the beauty parlor at any time.  7. Wear dark glasses with or without your own glasses if you are in bright light.  8. Call our office at [phone] or [phone] if you have sharp pain in your eye or unusual symptoms.  9. Do not be concerned because vision in the operative eye is not good. It will not be good, no matter how successful the operation, until you get a special lens for it. Your old glasses will not be suited to the new eye that was operated on and you will not be ready for a new lens for about a month.  10. Follow up at the [facility] office.    I have received a copy of the above instructions and will follow them.   Follow up with Dr. Gershon Crane between 2-3 pm today

## 2015-05-17 NOTE — Transfer of Care (Signed)
Immediate Anesthesia Transfer of Care Note  Patient: Barbara [name]  Procedure(s) Performed: Procedure(s) with comments: CATARACT EXTRACTION PHACO AND INTRAOCULAR LENS PLACEMENT (IOC) (Right) - CDE:8.33  Patient Location: Short Stay  Anesthesia Type:MAC  Level of Consciousness: awake, alert , oriented and patient cooperative  Airway & Oxygen Therapy: Patient Spontanous Breathing  Post-op Assessment: Report given to RN and Post -op Vital signs reviewed and stable  Post vital signs: Reviewed and stable  Last Vitals:  Filed Vitals:   05/17/15 1015  BP: 140/72  Pulse:   Temp:   Resp: 19    Complications: No apparent anesthesia complications

## 2015-05-17 NOTE — Op Note (Signed)
Patient brought to the operating room and prepped and draped in the usual manner.  Lid speculum inserted in right eye.  Stab incision made at the twelve o'clock position.  Provisc instilled in the anterior chamber.   A 2.4 mm. Stab incision was made temporally.  Due to a small pupil, a Malugyn Ring was inserted.  An anterior capsulotomy was done with a bent 25 gauge needle.  The nucleus was hydrodissected.  The Phaco tip was inserted in the anterior chamber and the nucleus was emulsified.  CDE was 8.33.  The cortical material was then removed with the I and A tip.  Posterior capsule was the polished.  The anterior chamber was deepened with Provisc.  A 21.0 Diopter Alcon SN60WF IOL was then inserted in the capsular bag.  Provisc was then removed with the I and A tip.  The wound was then hydrated.  Patient sent to the Recovery Room in good condition with follow up in my office.  Preoperative Diagnosis:  Nuclear Cataract OD Postoperative Diagnosis:  Same Procedure name: Kelman Phacoemulsification OD with IOL

## 2015-05-17 NOTE — Anesthesia Postprocedure Evaluation (Signed)
  Anesthesia Post-op Note  Patient: Barbara [name]  Procedure(s) Performed: Procedure(s) with comments: CATARACT EXTRACTION PHACO AND INTRAOCULAR LENS PLACEMENT (IOC) (Right) - CDE:8.33  Patient Location: Short Stay  Anesthesia Type:MAC  Level of Consciousness: awake, alert  and oriented  Airway and Oxygen Therapy: Patient Spontanous Breathing  Post-op Pain: none  Post-op Assessment: Post-op Vital signs reviewed, Patient's Cardiovascular Status Stable, Respiratory Function Stable, Patent Airway, No signs of Nausea or vomiting and Pain level controlled              Post-op Vital Signs: Reviewed and stable  Last Vitals:  Filed Vitals:   05/17/15 1015  BP: 140/72  Pulse:   Temp:   Resp: 19    Complications: No apparent anesthesia complications

## 2015-05-18 ENCOUNTER — Encounter (HOSPITAL_COMMUNITY): Payer: Self-pay | Admitting: Ophthalmology

## 2015-05-26 ENCOUNTER — Encounter (HOSPITAL_COMMUNITY)
Admission: RE | Admit: 2015-05-26 | Discharge: 2015-05-26 | Disposition: A | Discharge status: Home / Self Care | Payer: Medicare Other | Source: Ambulatory Visit | Attending: Ophthalmology | Admitting: Ophthalmology

## 2015-05-26 ENCOUNTER — Encounter (HOSPITAL_COMMUNITY): Payer: Self-pay

## 2015-05-26 DIAGNOSIS — H2512 Age-related nuclear cataract, left eye: Secondary | ICD-10-CM | POA: Diagnosis not present

## 2015-05-26 DIAGNOSIS — H25012 Cortical age-related cataract, left eye: Secondary | ICD-10-CM | POA: Diagnosis not present

## 2015-05-30 MED ORDER — CYCLOPENTOLATE-PHENYLEPHRINE OP SOLN OPTIME - NO CHARGE
OPHTHALMIC | Status: AC
  Filled 2015-05-30: qty 2

## 2015-05-30 MED ORDER — PHENYLEPHRINE HCL 2.5 % OP SOLN
OPHTHALMIC | Status: AC
  Filled 2015-05-30: qty 15

## 2015-05-30 MED ORDER — TETRACAINE HCL 0.5 % OP SOLN
OPHTHALMIC | Status: AC
  Filled 2015-05-30: qty 2

## 2015-05-30 MED ORDER — KETOROLAC TROMETHAMINE 0.5 % OP SOLN
OPHTHALMIC | Status: AC
  Filled 2015-05-30: qty 5

## 2015-05-31 ENCOUNTER — Ambulatory Visit (HOSPITAL_COMMUNITY): Payer: Medicare Other | Admitting: Anesthesiology

## 2015-05-31 ENCOUNTER — Ambulatory Visit (HOSPITAL_COMMUNITY)
Admission: RE | Admit: 2015-05-31 | Discharge: 2015-05-31 | Disposition: A | Discharge status: Home / Self Care | Payer: Medicare Other | Source: Ambulatory Visit | Attending: Ophthalmology | Admitting: Ophthalmology

## 2015-05-31 ENCOUNTER — Encounter (HOSPITAL_COMMUNITY): Payer: Self-pay | Admitting: Emergency Medicine

## 2015-05-31 ENCOUNTER — Encounter (HOSPITAL_COMMUNITY)
Admission: RE | Disposition: A | Discharge status: Home / Self Care | Payer: Self-pay | Source: Ambulatory Visit | Attending: Ophthalmology

## 2015-05-31 DIAGNOSIS — F17200 Nicotine dependence, unspecified, uncomplicated: Secondary | ICD-10-CM | POA: Diagnosis not present

## 2015-05-31 DIAGNOSIS — H25012 Cortical age-related cataract, left eye: Secondary | ICD-10-CM | POA: Diagnosis not present

## 2015-05-31 DIAGNOSIS — H2512 Age-related nuclear cataract, left eye: Secondary | ICD-10-CM | POA: Insufficient documentation

## 2015-05-31 DIAGNOSIS — Z79899 Other long term (current) drug therapy: Secondary | ICD-10-CM | POA: Insufficient documentation

## 2015-05-31 DIAGNOSIS — Z7982 Long term (current) use of aspirin: Secondary | ICD-10-CM | POA: Insufficient documentation

## 2015-05-31 DIAGNOSIS — H259 Unspecified age-related cataract: Secondary | ICD-10-CM | POA: Diagnosis not present

## 2015-05-31 DIAGNOSIS — H5703 Miosis: Secondary | ICD-10-CM | POA: Diagnosis not present

## 2015-05-31 DIAGNOSIS — G309 Alzheimer's disease, unspecified: Secondary | ICD-10-CM | POA: Diagnosis not present

## 2015-05-31 DIAGNOSIS — F0280 Dementia in other diseases classified elsewhere without behavioral disturbance: Secondary | ICD-10-CM | POA: Diagnosis not present

## 2015-05-31 DIAGNOSIS — I10 Essential (primary) hypertension: Secondary | ICD-10-CM | POA: Insufficient documentation

## 2015-05-31 DIAGNOSIS — M1990 Unspecified osteoarthritis, unspecified site: Secondary | ICD-10-CM | POA: Diagnosis not present

## 2015-05-31 HISTORY — PX: CATARACT EXTRACTION W/PHACO: SHX586

## 2015-05-31 SURGERY — PHACOEMULSIFICATION, CATARACT, WITH IOL INSERTION
Anesthesia: Monitor Anesthesia Care | Site: Eye | Laterality: Left

## 2015-05-31 MED ORDER — LIDOCAINE HCL (PF) 1 % IJ SOLN
INTRAMUSCULAR | Status: DC | PRN
Start: 2015-05-31 — End: 2015-05-31
  Administered 2015-05-31: .5 mL

## 2015-05-31 MED ORDER — TETRACAINE HCL 0.5 % OP SOLN
1.0000 [drp] | OPHTHALMIC | Status: AC
  Administered 2015-05-31 (×3): 1 [drp] via OPHTHALMIC

## 2015-05-31 MED ORDER — LACTATED RINGERS IV SOLN
INTRAVENOUS | Status: DC
  Administered 2015-05-31: 75 mL/h via INTRAVENOUS

## 2015-05-31 MED ORDER — PHENYLEPHRINE HCL 2.5 % OP SOLN
1.0000 [drp] | OPHTHALMIC | Status: AC
  Administered 2015-05-31 (×3): 1 [drp] via OPHTHALMIC

## 2015-05-31 MED ORDER — EPINEPHRINE HCL 1 MG/ML IJ SOLN
INTRAMUSCULAR | Status: AC
  Filled 2015-05-31: qty 1

## 2015-05-31 MED ORDER — PROVISC 10 MG/ML IO SOLN
INTRAOCULAR | Status: DC | PRN
Start: 2015-05-31 — End: 2015-05-31
  Administered 2015-05-31: 0.85 mL via INTRAOCULAR

## 2015-05-31 MED ORDER — MIDAZOLAM HCL 2 MG/2ML IJ SOLN
1.0000 mg | INTRAMUSCULAR | Status: DC | PRN
  Administered 2015-05-31: 2 mg via INTRAVENOUS
  Filled 2015-05-31: qty 2

## 2015-05-31 MED ORDER — BSS IO SOLN
INTRAOCULAR | Status: DC | PRN
Start: 2015-05-31 — End: 2015-05-31
  Administered 2015-05-31: 15 mL

## 2015-05-31 MED ORDER — CYCLOPENTOLATE-PHENYLEPHRINE 0.2-1 % OP SOLN
1.0000 [drp] | OPHTHALMIC | Status: AC
  Administered 2015-05-31 (×3): 1 [drp] via OPHTHALMIC

## 2015-05-31 MED ORDER — LIDOCAINE HCL (PF) 1 % IJ SOLN
INTRAMUSCULAR | Status: AC
  Filled 2015-05-31: qty 2

## 2015-05-31 MED ORDER — KETOROLAC TROMETHAMINE 0.5 % OP SOLN
1.0000 [drp] | OPHTHALMIC | Status: AC
  Administered 2015-05-31 (×3): 1 [drp] via OPHTHALMIC

## 2015-05-31 MED ORDER — TETRACAINE 0.5 % OP SOLN OPTIME - NO CHARGE
OPHTHALMIC | Status: DC | PRN
Start: 2015-05-31 — End: 2015-05-31
  Administered 2015-05-31: 2 [drp] via OPHTHALMIC

## 2015-05-31 MED ORDER — FENTANYL CITRATE (PF) 100 MCG/2ML IJ SOLN
25.0000 ug | Freq: Once | INTRAMUSCULAR | Status: AC
  Administered 2015-05-31: 25 ug via INTRAVENOUS
  Filled 2015-05-31: qty 2

## 2015-05-31 MED ORDER — EPINEPHRINE HCL 1 MG/ML IJ SOLN
INTRAMUSCULAR | Status: DC | PRN
  Administered 2015-05-31: 500 mL

## 2015-05-31 SURGICAL SUPPLY — 11 items
CLOTH BEACON ORANGE TIMEOUT ST (SAFETY) ×2 IMPLANT
EYE SHIELD UNIVERSAL CLEAR (GAUZE/BANDAGES/DRESSINGS) ×2 IMPLANT
GLOVE BIO SURGEON STRL SZ 6.5 (GLOVE) ×1 IMPLANT
GLOVE BIO SURGEONS STRL SZ 6.5 (GLOVE) ×1
GLOVE EXAM NITRILE MD LF STRL (GLOVE) ×2 IMPLANT
LENS ALC ACRYL/TECN (Ophthalmic Related) ×3 IMPLANT
PAD ARMBOARD 7.5X6 YLW CONV (MISCELLANEOUS) ×2 IMPLANT
RING MALYGIN (MISCELLANEOUS) ×2 IMPLANT
TAPE SURG TRANSPORE 1 IN (GAUZE/BANDAGES/DRESSINGS) IMPLANT
TAPE SURGICAL TRANSPORE 1 IN (GAUZE/BANDAGES/DRESSINGS) ×2
WATER STERILE IRR 250ML POUR (IV SOLUTION) ×2 IMPLANT

## 2015-05-31 NOTE — Op Note (Signed)
Patient brought to the operating room and prepped and draped in the usual manner.  Lid speculum inserted in left eye.  Stab incision made at the twelve o'clock position.  Provisc instilled in the anterior chamber.   A 2.4 mm. Stab incision was made temporally. Due to a small pupil, a Malugyn Ring was inserted.  An anterior capsulotomy was done with a bent 25 gauge needle.  The nucleus was hydrodissected.  The Phaco tip was inserted in the anterior chamber and the nucleus was emulsified.  CDE was 8.80.  The cortical material was then removed with the I and A tip.  Posterior capsule was the polished.  The anterior chamber was deepened with Provisc.  A 21.0 Diopter Alcon SN60WF IOL was then inserted in the capsular bag.  The Malugyn Ring was removed.  Provisc was then removed with the I and A tip.  The wound was then hydrated.  Patient sent to the Recovery Room in good condition with follow up in my office.  Preoperative Diagnosis:  Nuclear Cataract OS Postoperative Diagnosis:  Same Procedure name: Kelman Phacoemulsification OS with IOL

## 2015-05-31 NOTE — Anesthesia Preprocedure Evaluation (Signed)
Anesthesia Evaluation  Patient identified by MRN, date of birth, ID band Patient awake    Reviewed: Allergy & Precautions, NPO status , Patient's Chart, lab work & pertinent test results, reviewed documented beta blocker date and time   Airway Mallampati: II  TM Distance: >3 FB     Dental  (+) Edentulous Upper, Edentulous Lower   Pulmonary Current Smoker,  breath sounds clear to auscultation        Cardiovascular hypertension, Pt. on medications and Pt. on home beta blockers Rhythm:Regular Rate:Normal     Neuro/Psych PSYCHIATRIC DISORDERS (Alzheimers)    GI/Hepatic negative GI ROS,   Endo/Other    Renal/GU      Musculoskeletal   Abdominal   Peds  Hematology   Anesthesia Other Findings   Reproductive/Obstetrics                             Anesthesia Physical Anesthesia Plan  ASA: III  Anesthesia Plan: MAC   Post-op Pain Management:    Induction: Intravenous  Airway Management Planned: Nasal Cannula  Additional Equipment:   Intra-op Plan:   Post-operative Plan:   Informed Consent: I have reviewed the patients History and Physical, chart, labs and discussed the procedure including the risks, benefits and alternatives for the proposed anesthesia with the patient or authorized representative who has indicated his/her understanding and acceptance.     Plan Discussed with:   Anesthesia Plan Comments:         Anesthesia Quick Evaluation

## 2015-05-31 NOTE — Discharge Instructions (Signed)
NOAH PELAEZ  05/31/2015           [place] Instructions [place] [address]      1. Avoid closing eyes tightly. One often closes the eye tightly when laughing, talking, sneezing, coughing or if they feel irritated. At these times, you should be careful not to close your eyes tightly.  2. Instill eye drops as instructed. To instill drops in your eye, open it, look up and have someone gently pull the lower lid down and instill a couple of drops inside the lower lid.  3. Do not touch upper lid.  4. Take Advil or Tylenol for pain.  5. You may use either eye for near work, such as reading or sewing and you may watch television.  6. You may have your hair done at the beauty parlor at any time.  7. Wear dark glasses with or without your own glasses if you are in bright light.  8. Call our office at [phone] or [phone] if you have sharp pain in your eye or unusual symptoms.  9. Do not be concerned because vision in the operative eye is not good. It will not be good, no matter how successful the operation, until you get a special lens for it. Your old glasses will not be suited to the new eye that was operated on and you will not be ready for a new lens for about a month.  10. Follow up at the [facility] office.    I have received a copy of the above instructions and will follow them.      Cataract Surgery Care After Refer to this sheet in the next few weeks. These instructions provide you with information on caring for yourself after your procedure. Your caregiver may also give you more specific instructions. Your treatment has been planned according to current medical practices, but problems sometimes occur. Call your caregiver if you have any problems or questions after your procedure.  HOME CARE INSTRUCTIONS   Avoid strenuous activities as directed by your caregiver.  Ask your caregiver when you can resume  driving.  Use eyedrops or other medicines to help healing and control pressure inside your eye as directed by your caregiver.  Only take over-the-counter or prescription medicines for pain, discomfort, or fever as directed by your caregiver.  Do not to touch or rub your eyes.  You may be instructed to use a protective shield during the first few days and nights after surgery. If not, wear sunglasses to protect your eyes. This is to protect the eye from pressure or from being accidentally bumped.  Keep the area around your eye clean and dry. Avoid swimming or allowing water to hit you directly in the face while showering. Keep soap and shampoo out of your eyes.  Do not bend or lift heavy objects. Bending increases pressure in the eye. You can walk, climb stairs, and do light household chores.  Do not put a contact lens into the eye that had surgery until your caregiver says it is okay to do so.  Ask your doctor when you can return to work. This will depend on the kind of work that you do. If you work in a dusty environment, you may be advised to wear protective eyewear for a period of time.  Ask your caregiver when it will be safe to engage in sexual activity.  Continue with your regular eye exams as directed by your caregiver. What  to expect:  It is normal to feel itching and mild discomfort for a few days after cataract surgery. Some fluid discharge is also common, and your eye may be sensitive to light and touch.  After 1 to 2 days, even moderate discomfort should disappear. In most cases, healing will take about 6 weeks.  If you received an intraocular lens (IOL), you may notice that colors are very bright or have a blue tinge. Also, if you have been in bright sunlight, everything may appear reddish for a few hours. If you see these color tinges, it is because your lens is clear and no longer cloudy. Within a few months after receiving an IOL, these extra colors should go away. When you  have healed, you will probably need new glasses. SEEK MEDICAL CARE IF:   You have increased bruising around your eye.  You have discomfort not helped by medicine. SEEK IMMEDIATE MEDICAL CARE IF:   You have a fever.  You have a worsening or sudden vision loss.  You have redness, swelling, or increasing pain in the eye.  You have a thick discharge from the eye that had surgery. MAKE SURE YOU:  Understand these instructions.  Will watch your condition.  Will get help right away if you are not doing well or get worse. Document Released: 05/25/2005 Document Revised: 01/28/2012 Document Reviewed: 06/29/2011 [facility] Patient Information 2015 [place], [place]. This information is not intended to replace advice given to you by your health care provider. Make sure you discuss any questions you have with your health care provider.

## 2015-05-31 NOTE — H&P (Signed)
The patient was re examined and there is no change in the patients condition since the original H and P. 

## 2015-05-31 NOTE — Transfer of Care (Signed)
Immediate Anesthesia Transfer of Care Note  Patient: Barbara [name]  Procedure(s) Performed: Procedure(s) (LRB): CATARACT EXTRACTION PHACO AND INTRAOCULAR LENS PLACEMENT (IOC) (Left)  Patient Location: Shortstay  Anesthesia Type: MAC  Level of Consciousness: awake  Airway & Oxygen Therapy: Patient Spontanous Breathing   Post-op Assessment: Report given to PACU RN, Post -op Vital signs reviewed and stable and Patient moving all extremities  Post vital signs: Reviewed and stable  Complications: No apparent anesthesia complications

## 2015-05-31 NOTE — Anesthesia Procedure Notes (Signed)
Procedure Name: MAC Date/Time: 05/31/2015 8:12 AM Performed by: Vista Deck Pre-anesthesia Checklist: Patient identified, Emergency Drugs available, Suction available, Timeout performed and Patient being monitored Patient Re-evaluated:Patient Re-evaluated prior to inductionOxygen Delivery Method: Nasal Cannula

## 2015-05-31 NOTE — Anesthesia Postprocedure Evaluation (Signed)
  Anesthesia Post-op Note  Patient: Barbara [name]  Procedure(s) Performed: Procedure(s) (LRB): CATARACT EXTRACTION PHACO AND INTRAOCULAR LENS PLACEMENT (IOC) (Left)  Patient Location:  Short Stay  Anesthesia Type: MAC  Level of Consciousness: awake  Airway and Oxygen Therapy: Patient Spontanous Breathing  Post-op Pain: none  Post-op Assessment: Post-op Vital signs reviewed, Patient's Cardiovascular Status Stable, Respiratory Function Stable, Patent Airway, No signs of Nausea or vomiting and Pain level controlled  Post-op Vital Signs: Reviewed and stable  Complications: No apparent anesthesia complications

## 2015-06-02 ENCOUNTER — Encounter (HOSPITAL_COMMUNITY): Payer: Self-pay | Admitting: Ophthalmology

## 2015-06-10 DIAGNOSIS — I10 Essential (primary) hypertension: Secondary | ICD-10-CM | POA: Diagnosis not present

## 2015-06-10 DIAGNOSIS — Z6824 Body mass index (BMI) 24.0-24.9, adult: Secondary | ICD-10-CM | POA: Diagnosis not present

## 2015-06-10 DIAGNOSIS — G309 Alzheimer's disease, unspecified: Secondary | ICD-10-CM | POA: Diagnosis not present

## 2015-10-07 DIAGNOSIS — E785 Hyperlipidemia, unspecified: Secondary | ICD-10-CM | POA: Diagnosis not present

## 2015-10-07 DIAGNOSIS — D649 Anemia, unspecified: Secondary | ICD-10-CM | POA: Diagnosis not present

## 2015-10-07 DIAGNOSIS — I10 Essential (primary) hypertension: Secondary | ICD-10-CM | POA: Diagnosis not present

## 2015-10-07 DIAGNOSIS — G309 Alzheimer's disease, unspecified: Secondary | ICD-10-CM | POA: Diagnosis not present

## 2015-10-21 DIAGNOSIS — G309 Alzheimer's disease, unspecified: Secondary | ICD-10-CM | POA: Diagnosis not present

## 2015-10-21 DIAGNOSIS — Z23 Encounter for immunization: Secondary | ICD-10-CM | POA: Diagnosis not present

## 2015-10-21 DIAGNOSIS — Z6824 Body mass index (BMI) 24.0-24.9, adult: Secondary | ICD-10-CM | POA: Diagnosis not present

## 2015-10-21 DIAGNOSIS — I10 Essential (primary) hypertension: Secondary | ICD-10-CM | POA: Diagnosis not present

## 2015-10-21 DIAGNOSIS — N183 Chronic kidney disease, stage 3 (moderate): Secondary | ICD-10-CM | POA: Diagnosis not present

## 2015-10-24 ENCOUNTER — Encounter (INDEPENDENT_AMBULATORY_CARE_PROVIDER_SITE_OTHER): Payer: Self-pay | Admitting: *Deleted

## 2015-10-24 ENCOUNTER — Other Ambulatory Visit (INDEPENDENT_AMBULATORY_CARE_PROVIDER_SITE_OTHER): Payer: Self-pay | Admitting: Internal Medicine

## 2015-10-24 DIAGNOSIS — D509 Iron deficiency anemia, unspecified: Secondary | ICD-10-CM

## 2015-10-24 DIAGNOSIS — R195 Other fecal abnormalities: Secondary | ICD-10-CM

## 2015-10-28 ENCOUNTER — Encounter (HOSPITAL_COMMUNITY): Payer: Self-pay

## 2015-10-28 ENCOUNTER — Encounter (HOSPITAL_COMMUNITY)
Admission: RE | Disposition: A | Discharge status: Home / Self Care | Payer: Self-pay | Source: Ambulatory Visit | Attending: Internal Medicine

## 2015-10-28 ENCOUNTER — Ambulatory Visit (HOSPITAL_COMMUNITY)
Admission: RE | Admit: 2015-10-28 | Discharge: 2015-10-28 | Disposition: A | Discharge status: Home / Self Care | Payer: Medicare Other | Source: Ambulatory Visit | Attending: Internal Medicine | Admitting: Internal Medicine

## 2015-10-28 ENCOUNTER — Other Ambulatory Visit (INDEPENDENT_AMBULATORY_CARE_PROVIDER_SITE_OTHER): Payer: Self-pay | Admitting: Internal Medicine

## 2015-10-28 DIAGNOSIS — I10 Essential (primary) hypertension: Secondary | ICD-10-CM | POA: Diagnosis not present

## 2015-10-28 DIAGNOSIS — D509 Iron deficiency anemia, unspecified: Secondary | ICD-10-CM | POA: Insufficient documentation

## 2015-10-28 DIAGNOSIS — M1991 Primary osteoarthritis, unspecified site: Secondary | ICD-10-CM | POA: Insufficient documentation

## 2015-10-28 DIAGNOSIS — Z7982 Long term (current) use of aspirin: Secondary | ICD-10-CM | POA: Insufficient documentation

## 2015-10-28 DIAGNOSIS — E7800 Pure hypercholesterolemia, unspecified: Secondary | ICD-10-CM | POA: Diagnosis not present

## 2015-10-28 DIAGNOSIS — Z79899 Other long term (current) drug therapy: Secondary | ICD-10-CM | POA: Insufficient documentation

## 2015-10-28 DIAGNOSIS — K311 Adult hypertrophic pyloric stenosis: Secondary | ICD-10-CM | POA: Diagnosis not present

## 2015-10-28 DIAGNOSIS — G309 Alzheimer's disease, unspecified: Secondary | ICD-10-CM | POA: Insufficient documentation

## 2015-10-28 DIAGNOSIS — K56699 Other intestinal obstruction unspecified as to partial versus complete obstruction: Secondary | ICD-10-CM

## 2015-10-28 DIAGNOSIS — K5660 Unspecified intestinal obstruction: Secondary | ICD-10-CM | POA: Insufficient documentation

## 2015-10-28 DIAGNOSIS — K644 Residual hemorrhoidal skin tags: Secondary | ICD-10-CM | POA: Diagnosis not present

## 2015-10-28 DIAGNOSIS — R195 Other fecal abnormalities: Secondary | ICD-10-CM | POA: Diagnosis not present

## 2015-10-28 DIAGNOSIS — K449 Diaphragmatic hernia without obstruction or gangrene: Secondary | ICD-10-CM | POA: Insufficient documentation

## 2015-10-28 DIAGNOSIS — F17210 Nicotine dependence, cigarettes, uncomplicated: Secondary | ICD-10-CM | POA: Diagnosis not present

## 2015-10-28 DIAGNOSIS — K648 Other hemorrhoids: Secondary | ICD-10-CM | POA: Diagnosis not present

## 2015-10-28 DIAGNOSIS — K269 Duodenal ulcer, unspecified as acute or chronic, without hemorrhage or perforation: Secondary | ICD-10-CM

## 2015-10-28 DIAGNOSIS — K5669 Other intestinal obstruction: Secondary | ICD-10-CM | POA: Diagnosis not present

## 2015-10-28 DIAGNOSIS — Z791 Long term (current) use of non-steroidal anti-inflammatories (NSAID): Secondary | ICD-10-CM | POA: Diagnosis not present

## 2015-10-28 DIAGNOSIS — K259 Gastric ulcer, unspecified as acute or chronic, without hemorrhage or perforation: Secondary | ICD-10-CM | POA: Diagnosis not present

## 2015-10-28 DIAGNOSIS — K5730 Diverticulosis of large intestine without perforation or abscess without bleeding: Secondary | ICD-10-CM | POA: Insufficient documentation

## 2015-10-28 DIAGNOSIS — K2970 Gastritis, unspecified, without bleeding: Secondary | ICD-10-CM | POA: Insufficient documentation

## 2015-10-28 HISTORY — PX: COLONOSCOPY: SHX5424

## 2015-10-28 HISTORY — PX: ESOPHAGOGASTRODUODENOSCOPY: SHX5428

## 2015-10-28 LAB — CREATININE, SERUM
CREATININE: 1.02 mg/dL — AB (ref 0.44–1.00)
GFR calc non Af Amer: 49 mL/min — ABNORMAL LOW (ref >60)
GFR, EST AFRICAN AMERICAN: 56 mL/min — AB (ref >60)

## 2015-10-28 SURGERY — COLONOSCOPY
Anesthesia: Moderate Sedation

## 2015-10-28 MED ORDER — SODIUM CHLORIDE 0.9 % IV SOLN
INTRAVENOUS | Status: DC
  Administered 2015-10-28: 07:00:00 via INTRAVENOUS

## 2015-10-28 MED ORDER — MIDAZOLAM HCL 5 MG/5ML IJ SOLN
INTRAMUSCULAR | Status: AC
  Filled 2015-10-28: qty 10

## 2015-10-28 MED ORDER — MEPERIDINE HCL 50 MG/ML IJ SOLN
INTRAMUSCULAR | Status: AC
  Filled 2015-10-28: qty 1

## 2015-10-28 MED ORDER — BUTAMBEN-TETRACAINE-BENZOCAINE 2-2-14 % EX AERO
INHALATION_SPRAY | CUTANEOUS | Status: DC | PRN
  Administered 2015-10-28: 2 via TOPICAL

## 2015-10-28 MED ORDER — MIDAZOLAM HCL 5 MG/5ML IJ SOLN
INTRAMUSCULAR | Status: DC | PRN
Start: 2015-10-28 — End: 2015-10-28
  Administered 2015-10-28 (×2): 1 mg via INTRAVENOUS

## 2015-10-28 MED ORDER — STERILE WATER FOR IRRIGATION IR SOLN
Status: DC | PRN
  Administered 2015-10-28: 08:00:00

## 2015-10-28 MED ORDER — FLINTSTONES PLUS IRON PO CHEW: 1.0000 | CHEWABLE_TABLET | Freq: Two times a day (BID) | ORAL | Status: DC

## 2015-10-28 MED ORDER — MEPERIDINE HCL 50 MG/ML IJ SOLN
INTRAMUSCULAR | Status: DC | PRN
  Administered 2015-10-28 (×3): 15 mg

## 2015-10-28 MED ORDER — PANTOPRAZOLE SODIUM 40 MG PO TBEC: 40.0000 mg | DELAYED_RELEASE_TABLET | Freq: Every day | ORAL | Status: DC

## 2015-10-28 NOTE — Discharge Instructions (Signed)
Resume usual medications including low-dose aspirin and usual diet. Do not take Advil and Aleve or similar medications. Pantoprazole 40 mg by mouth 30 minutes before breakfast daily. Flintstones with iron chewable 1 tablet twice daily. Can take Tylenol up to 2 g per day for musculoskeletal pain or headache as needed. Abdominal pelvic CT to be scheduled next week. Office will call.  Colonoscopy, Care After These instructions give you information on caring for yourself after your procedure. Your doctor may also give you more specific instructions. Call your doctor if you have any problems or questions after your procedure. HOME CARE  Do not drive for 24 hours.  Do not sign important papers or use machinery for 24 hours.  You may shower.  You may go back to your usual activities, but go slower for the first 24 hours.  Take rest breaks often during the first 24 hours.  Walk around or use warm packs on your belly (abdomen) if you have belly cramping or gas.  Drink enough fluids to keep your pee (urine) clear or pale yellow.  Resume your normal diet. Avoid heavy or fried foods.  Avoid drinking alcohol for 24 hours or as told by your doctor.  Only take medicines as told by your doctor. If a tissue sample (biopsy) was taken during the procedure:   Do not take aspirin or blood thinners for 7 days, or as told by your doctor.  Do not drink alcohol for 7 days, or as told by your doctor.  Eat soft foods for the first 24 hours. GET HELP IF: You still have a small amount of blood in your poop (stool) 2-3 days after the procedure. GET HELP RIGHT AWAY IF:  You have more than a small amount of blood in your poop.  You see clumps of tissue (blood clots) in your poop.  Your belly is puffy (swollen).  You feel sick to your stomach (nauseous) or throw up (vomit).  You have a fever.  You have belly pain that gets worse and medicine does not help. MAKE SURE YOU:  Understand these  instructions.  Will watch your condition.  Will get help right away if you are not doing well or get worse.   This information is not intended to replace advice given to you by your health care provider. Make sure you discuss any questions you have with your health care provider.   Document Released: 12/08/2010 Document Revised: 11/10/2013 Document Reviewed: 07/13/2013 Elsevier Interactive Patient Education [employer].

## 2015-10-28 NOTE — H&P (Signed)
Barbara [name] is an 81 y.o. female.   Chief Complaint: Patient is here for EGD and colonoscopy. HPI: Patient is 81 year old African-American female who is here for diagnostic EGD and colonoscopy. She was recently found to have heme-positive stool and iron deficiency anemia by her primary care physician [name]. She was noted to have hemoglobin of 8.2 g. Patient denies abdominal pain diarrhea constipation melena or rectal bleeding. She also denies heartburn or dysphagia nausea or vomiting. She states she has good appetite. She is on low-dose aspirin. She does not take other OTC NSAIDs. Last colonoscopy was possibly over 15 years ago.  Past Medical History  Diagnosis Date  . Hypertension     High Cholesterol  . Alzheimer disease   . Arthritis     Past Surgical History  Procedure Laterality Date  . Orthopedic surgery Right     fractured ankle  . Abdominal surgery    . Dilation and curettage of uterus    . Cataract extraction w/phaco Right 05/17/2015    Procedure: CATARACT EXTRACTION PHACO AND INTRAOCULAR LENS PLACEMENT (IOC);  Surgeon: Rutherford Guys, MD;  Location: [facility];  Service: Ophthalmology;  Laterality: Right;  CDE:8.33  . Cataract extraction w/phaco Left 05/31/2015    Procedure: CATARACT EXTRACTION PHACO AND INTRAOCULAR LENS PLACEMENT (IOC);  Surgeon: Rutherford Guys, MD;  Location: [facility];  Service: Ophthalmology;  Laterality: Left;  CDE:8.80    History reviewed. No pertinent family history. Social History:  reports that she has been smoking Cigarettes.  She has a 40 pack-year smoking history. She does not have any smokeless tobacco history on file. She reports that she does not drink alcohol or use illicit drugs.  Allergies: No Known Allergies  Medications Prior to Admission  Medication Sig Dispense Refill  . aspirin EC 81 MG tablet Take 81 mg by mouth daily.    Marland Kitchen atenolol (TENORMIN) 50 MG tablet Take 50 mg by mouth daily.    Marland Kitchen diltiazem (TIAZAC) 300 MG 24 hr capsule Take 300 mg  by mouth daily.    Marland Kitchen donepezil (ARICEPT) 10 MG tablet Take 10 mg by mouth at bedtime.    Marland Kitchen losartan-hydrochlorothiazide (HYZAAR) 100-25 MG per tablet Take 1 tablet by mouth daily.    . potassium chloride SA (K-DUR,KLOR-CON) 20 MEQ tablet Take 20 mEq by mouth daily.    . diclofenac sodium (VOLTAREN) 1 % GEL Apply 2 g topically 4 (four) times daily.    Marland Kitchen ofloxacin (OCUFLOX) 0.3 % ophthalmic solution 1 drop 4 (four) times daily.      No results found for this or any previous visit (from the past 48 hour(s)). No results found.  ROS  Blood pressure 131/57, pulse 81, temperature 98.4 F (36.9 C), temperature source Oral, resp. rate 13, height 5\' 3"  (1.6 m), weight 121 lb (54.885 kg), SpO2 100 %. Physical Exam  Constitutional:  Well-developed thin African-American female in NAD.  HENT:  Mouth/Throat: Oropharynx is clear and moist. No oropharyngeal exudate.  Eyes:  Ecchymosis noted involving outer aspect of right upper eyelid with small scab covered wound in the center Conjunctiva is pale.  Neck: No thyromegaly present.  Cardiovascular: Normal rate, regular rhythm and normal heart sounds.   No murmur heard. Respiratory: Effort normal and breath sounds normal.  GI: She exhibits no distension and no mass. There is no tenderness.  Musculoskeletal: She exhibits no edema.  Neurological: She is alert.  Skin: Skin is warm and dry.     Assessment/Plan Heme positive stool and iron  deficiency anemia. Diagnostic EGD and colonoscopy.  [name],Barbara U 10/28/2015, 7:35 AM

## 2015-10-28 NOTE — Op Note (Signed)
EGD AND COLONOSCOPY PROCEDURE REPORT  PATIENT:  Barbara [name]  MR#:  [id number] Birthdate:  1928-11-06, 81 y.o., female Endoscopist:  Dr. Rogene Houston, MD Referred By:  Dr. Asencion Noble, MD Procedure Date: 10/28/2015  Procedure:   EGD & Colonoscopy  Indications:  Patient is 81 year old Caucasian female who was recently found to have heme-positive stool and iron deficiency anemia by [name]. Her hemoglobin is 8.2 g. Patient denies melena or rectal bleeding abdominal pain or change in her bowel habits. She is on low-dose aspirin but does not take other OTC NSAIDs.            Informed Consent:  The risks, benefits, alternatives & imponderables which include, but are not limited to, bleeding, infection, perforation, drug reaction and potential missed lesion have been reviewed.  The potential for biopsy, lesion removal, esophageal dilation, etc. have also been discussed.  Questions have been answered.  All parties agreeable.  Please see history & physical in medical record for more information.  Medications:  Demerol 45 mg IV Versed 3 mg IV Cetacaine spray topically for oropharyngeal anesthesia  EGD  Description of procedure:  The endoscope was introduced through the mouth and advanced to the second portion of the duodenum without difficulty or limitations. The mucosal surfaces were surveyed very carefully during advancement of the scope and upon withdrawal.  Findings:  Esophagus:  Mucosa of the esophagus was normal. GE junction was unremarkable. GEJ:  33 cm Hiatus:  36 cm Stomach:  Stomach was empty and distended very well with insufflation. Folds in the proximal stomach were normal. Focal erythema edema noted mucosa gastric body along the posterior wall. Antral mucosa was normal. Noncritical narrowing noted to pyloric channel. Angularis fundus and cardia were unremarkable. Duodenum:  Ulcer noted with clean base at distal bulb along with semi-lunar scar on the right side. Post bulbar mucosa  was normal.  Therapeutic/Diagnostic Maneuvers Performed:  None  COLONOSCOPY Description of procedure:  After a digital rectal exam was performed, that colonoscope was advanced from the anus through the rectum and colon to the area of the cecum, ileocecal valve and appendiceal orifice. The cecum was deeply intubated. These structures were well-seen and photographed for the record. From the level of the cecum and ileocecal valve, the scope was slowly and cautiously withdrawn. The mucosal surfaces were carefully surveyed utilizing scope tip to flexion to facilitate fold flattening as needed. The scope was pulled down into the rectum where a thorough exam including retroflexion was performed. Ultra Slim scope was used to complete the examination.  Findings:   Prep excellent. Normal mucosa of cecum, ascending colon, hepatic flexure, transverse colon, splenic flexure and descending colon. Stricture noted at distal sigmoid colon with pale mucosa. Unable to pass pediatric colonoscope across it. Able to pass ultra Slim scope in order to complete examination. Scattered diverticula at sigmoid colon without erythema or exudate. Normal rectal mucosa. Small hemorrhoids below the dentate line.   Therapeutic/Diagnostic Maneuvers Performed:  None  Complications:  None  Cecal Withdrawal Time:  7 minutes  EBL: None  Impression:  EGD findings: Small sliding hiatal hernia without changes of reflux esophagitis. Focal gastritis and body and noncritical pyloric channel stenosis. Distal bulbar ulcer without stigmata of bleed.  Colonoscopy findings: Examination performed to cecum. Distal sigmoid colon stricture with pale mucosa. Sigmoid colon diverticulosis. External hemorrhoids.  Comment: Sigmoid colon stricture suspected to be due to diverticular disease. Abdominopelvic CT will be obtained to rule out other causes. She possibly has been  losing blood from upper GI tract.   Recommendations:   Standard instructions given. Patient will continue low-dose aspirin but refrain from using other OTC NSAIDs. H. pylori serology and serum creatinine will be checked today. Pantoprazole 40 mg by mouth 30 minutes before breakfast daily. Flintstones with iron chewable 1 tablet twice a day. Abdominopelvic CT next week. Patient's daughter Ms. Barbara [name] will keep an eye on her bowel movements to make sure she does not develop constipation in which case she will be treated with stool softener. She'll have H&H when she sees [name] on 11/17/2015.      Barbara [name] U  10/28/2015 8:57 AM  CC: Dr. Asencion Noble, MD & Dr. Rayne Du ref. provider found

## 2015-10-29 LAB — H. PYLORI ANTIBODY, IGG: H Pylori IgG: 7.8 U/mL — ABNORMAL HIGH (ref 0.0–0.8)

## 2015-11-01 ENCOUNTER — Telehealth (INDEPENDENT_AMBULATORY_CARE_PROVIDER_SITE_OTHER): Payer: Self-pay | Admitting: *Deleted

## 2015-11-01 DIAGNOSIS — Z961 Presence of intraocular lens: Secondary | ICD-10-CM | POA: Diagnosis not present

## 2015-11-01 NOTE — Telephone Encounter (Signed)
Call returned and talked with patient's daughter Ms. Todd. H. pylori serology is positive. Will treat after CT scan completed.

## 2015-11-01 NOTE — Telephone Encounter (Signed)
JUST FYI: Barbara [name]'s daughter, Barbara [name] came in today to inquire about contrast for CT scheduled 11/03/15. Tried to explain to Barbara [name], that I didn't know all her answers and she would need to ask girls in radiology because we didn't that here. She proceed to tell me in a "kirt tone" and looked at my name badge that she knew this was not a hospital that this was an office and that the xray was done at the hospital and that she wasn't trying to be smart so there was no need for me to be smart with her. Tried to apologize for sounding "smart" but I couldn't answer her questions, she cut off our conversation and left, trying to slam door as she went out.

## 2015-11-02 ENCOUNTER — Encounter (HOSPITAL_COMMUNITY): Payer: Self-pay | Admitting: Internal Medicine

## 2015-11-03 ENCOUNTER — Ambulatory Visit (HOSPITAL_COMMUNITY)
Admission: RE | Admit: 2015-11-03 | Discharge: 2015-11-03 | Disposition: A | Discharge status: Home / Self Care | Payer: Medicare Other | Source: Ambulatory Visit | Attending: Internal Medicine | Admitting: Internal Medicine

## 2015-11-03 DIAGNOSIS — F0280 Dementia in other diseases classified elsewhere without behavioral disturbance: Secondary | ICD-10-CM | POA: Insufficient documentation

## 2015-11-03 DIAGNOSIS — I2510 Atherosclerotic heart disease of native coronary artery without angina pectoris: Secondary | ICD-10-CM | POA: Diagnosis not present

## 2015-11-03 DIAGNOSIS — E279 Disorder of adrenal gland, unspecified: Secondary | ICD-10-CM | POA: Diagnosis not present

## 2015-11-03 DIAGNOSIS — K5731 Diverticulosis of large intestine without perforation or abscess with bleeding: Secondary | ICD-10-CM | POA: Diagnosis not present

## 2015-11-03 DIAGNOSIS — G309 Alzheimer's disease, unspecified: Secondary | ICD-10-CM | POA: Diagnosis not present

## 2015-11-03 DIAGNOSIS — K56699 Other intestinal obstruction unspecified as to partial versus complete obstruction: Secondary | ICD-10-CM

## 2015-11-03 DIAGNOSIS — N289 Disorder of kidney and ureter, unspecified: Secondary | ICD-10-CM | POA: Diagnosis not present

## 2015-11-03 DIAGNOSIS — K5669 Other intestinal obstruction: Secondary | ICD-10-CM | POA: Insufficient documentation

## 2015-11-03 DIAGNOSIS — K5730 Diverticulosis of large intestine without perforation or abscess without bleeding: Secondary | ICD-10-CM | POA: Insufficient documentation

## 2015-11-03 MED ORDER — IOHEXOL 300 MG/ML  SOLN
80.0000 mL | Freq: Once | INTRAMUSCULAR | Status: AC | PRN
  Administered 2015-11-03: 80 mL via INTRAVENOUS

## 2015-11-15 DIAGNOSIS — D649 Anemia, unspecified: Secondary | ICD-10-CM | POA: Diagnosis not present

## 2015-11-17 DIAGNOSIS — K279 Peptic ulcer, site unspecified, unspecified as acute or chronic, without hemorrhage or perforation: Secondary | ICD-10-CM | POA: Diagnosis not present

## 2015-11-17 DIAGNOSIS — N281 Cyst of kidney, acquired: Secondary | ICD-10-CM | POA: Diagnosis not present

## 2015-11-17 DIAGNOSIS — D509 Iron deficiency anemia, unspecified: Secondary | ICD-10-CM | POA: Diagnosis not present

## 2015-11-18 ENCOUNTER — Other Ambulatory Visit (HOSPITAL_COMMUNITY): Payer: Self-pay | Admitting: Internal Medicine

## 2015-11-18 DIAGNOSIS — N2889 Other specified disorders of kidney and ureter: Secondary | ICD-10-CM

## 2015-11-25 ENCOUNTER — Ambulatory Visit (HOSPITAL_COMMUNITY): Admission: RE | Admit: 2015-11-25 | Payer: Medicare Other | Source: Ambulatory Visit

## 2015-11-30 ENCOUNTER — Ambulatory Visit (HOSPITAL_COMMUNITY): Admission: RE | Admit: 2015-11-30 | Payer: Medicare Other | Source: Ambulatory Visit

## 2015-12-13 ENCOUNTER — Ambulatory Visit (HOSPITAL_COMMUNITY): Admission: RE | Admit: 2015-12-13 | Payer: Medicare Other | Source: Ambulatory Visit

## 2015-12-13 DIAGNOSIS — D508 Other iron deficiency anemias: Secondary | ICD-10-CM | POA: Diagnosis not present

## 2015-12-20 DIAGNOSIS — Z6823 Body mass index (BMI) 23.0-23.9, adult: Secondary | ICD-10-CM | POA: Diagnosis not present

## 2015-12-20 DIAGNOSIS — N281 Cyst of kidney, acquired: Secondary | ICD-10-CM | POA: Diagnosis not present

## 2015-12-20 DIAGNOSIS — K279 Peptic ulcer, site unspecified, unspecified as acute or chronic, without hemorrhage or perforation: Secondary | ICD-10-CM | POA: Diagnosis not present

## 2015-12-20 DIAGNOSIS — D509 Iron deficiency anemia, unspecified: Secondary | ICD-10-CM | POA: Diagnosis not present

## 2016-01-14 ENCOUNTER — Observation Stay (HOSPITAL_COMMUNITY)
Admission: EM | Admit: 2016-01-14 | Discharge: 2016-01-17 | Disposition: A | Discharge status: Home / Self Care | Payer: Medicare Other | Attending: Internal Medicine | Admitting: Internal Medicine

## 2016-01-14 ENCOUNTER — Encounter (HOSPITAL_COMMUNITY): Payer: Self-pay

## 2016-01-14 ENCOUNTER — Emergency Department (HOSPITAL_COMMUNITY): Payer: Medicare Other

## 2016-01-14 DIAGNOSIS — F17210 Nicotine dependence, cigarettes, uncomplicated: Secondary | ICD-10-CM | POA: Diagnosis not present

## 2016-01-14 DIAGNOSIS — R42 Dizziness and giddiness: Secondary | ICD-10-CM | POA: Diagnosis not present

## 2016-01-14 DIAGNOSIS — E041 Nontoxic single thyroid nodule: Secondary | ICD-10-CM

## 2016-01-14 DIAGNOSIS — N2889 Other specified disorders of kidney and ureter: Secondary | ICD-10-CM

## 2016-01-14 DIAGNOSIS — D649 Anemia, unspecified: Secondary | ICD-10-CM | POA: Diagnosis present

## 2016-01-14 DIAGNOSIS — G309 Alzheimer's disease, unspecified: Secondary | ICD-10-CM | POA: Insufficient documentation

## 2016-01-14 DIAGNOSIS — Z791 Long term (current) use of non-steroidal anti-inflammatories (NSAID): Secondary | ICD-10-CM | POA: Diagnosis not present

## 2016-01-14 DIAGNOSIS — R55 Syncope and collapse: Secondary | ICD-10-CM | POA: Diagnosis present

## 2016-01-14 DIAGNOSIS — F4489 Other dissociative and conversion disorders: Secondary | ICD-10-CM | POA: Diagnosis not present

## 2016-01-14 DIAGNOSIS — I10 Essential (primary) hypertension: Secondary | ICD-10-CM | POA: Diagnosis not present

## 2016-01-14 DIAGNOSIS — N179 Acute kidney failure, unspecified: Secondary | ICD-10-CM | POA: Diagnosis not present

## 2016-01-14 DIAGNOSIS — R079 Chest pain, unspecified: Principal | ICD-10-CM | POA: Diagnosis present

## 2016-01-14 DIAGNOSIS — M1990 Unspecified osteoarthritis, unspecified site: Secondary | ICD-10-CM | POA: Insufficient documentation

## 2016-01-14 DIAGNOSIS — R4182 Altered mental status, unspecified: Secondary | ICD-10-CM | POA: Diagnosis not present

## 2016-01-14 DIAGNOSIS — E860 Dehydration: Secondary | ICD-10-CM | POA: Diagnosis not present

## 2016-01-14 DIAGNOSIS — Z79899 Other long term (current) drug therapy: Secondary | ICD-10-CM | POA: Diagnosis not present

## 2016-01-14 DIAGNOSIS — R404 Transient alteration of awareness: Secondary | ICD-10-CM | POA: Diagnosis not present

## 2016-01-14 DIAGNOSIS — R9431 Abnormal electrocardiogram [ECG] [EKG]: Secondary | ICD-10-CM | POA: Diagnosis not present

## 2016-01-14 DIAGNOSIS — I959 Hypotension, unspecified: Secondary | ICD-10-CM | POA: Diagnosis not present

## 2016-01-14 DIAGNOSIS — F0280 Dementia in other diseases classified elsewhere without behavioral disturbance: Secondary | ICD-10-CM | POA: Insufficient documentation

## 2016-01-14 HISTORY — DX: Gastro-esophageal reflux disease without esophagitis: K21.9

## 2016-01-14 HISTORY — DX: Essential (primary) hypertension: I10

## 2016-01-14 HISTORY — DX: Esophageal obstruction: K22.2

## 2016-01-14 HISTORY — DX: Hyperlipidemia, unspecified: E78.5

## 2016-01-14 LAB — COMPREHENSIVE METABOLIC PANEL
ALT: 14 U/L (ref 14–54)
ANION GAP: 7 (ref 5–15)
AST: 17 U/L (ref 15–41)
Albumin: 3 g/dL — ABNORMAL LOW (ref 3.5–5.0)
Alkaline Phosphatase: 68 U/L (ref 38–126)
BILIRUBIN TOTAL: 0.1 mg/dL — AB (ref 0.3–1.2)
BUN: 22 mg/dL — AB (ref 6–20)
CO2: 25 mmol/L (ref 22–32)
Calcium: 9 mg/dL (ref 8.9–10.3)
Chloride: 104 mmol/L (ref 101–111)
Creatinine, Ser: 1.45 mg/dL — ABNORMAL HIGH (ref 0.44–1.00)
GFR, EST AFRICAN AMERICAN: 37 mL/min — AB (ref >60)
GFR, EST NON AFRICAN AMERICAN: 32 mL/min — AB (ref >60)
Glucose, Bld: 134 mg/dL — ABNORMAL HIGH (ref 65–99)
POTASSIUM: 3.5 mmol/L (ref 3.5–5.1)
Sodium: 136 mmol/L (ref 135–145)
TOTAL PROTEIN: 7 g/dL (ref 6.5–8.1)

## 2016-01-14 LAB — CBC WITH DIFFERENTIAL/PLATELET
Basophils Absolute: 0 10*3/uL (ref 0.0–0.1)
Basophils Relative: 1 %
Eosinophils Absolute: 0.3 10*3/uL (ref 0.0–0.7)
Eosinophils Relative: 4 %
HEMATOCRIT: 26.5 % — AB (ref 36.0–46.0)
Hemoglobin: 8.3 g/dL — ABNORMAL LOW (ref 12.0–15.0)
LYMPHS PCT: 20 %
Lymphs Abs: 1.3 10*3/uL (ref 0.7–4.0)
MCH: 26.3 pg (ref 26.0–34.0)
MCHC: 31.3 g/dL (ref 30.0–36.0)
MCV: 83.9 fL (ref 78.0–100.0)
MONO ABS: 0.8 10*3/uL (ref 0.1–1.0)
MONOS PCT: 11 %
NEUTROS ABS: 4.3 10*3/uL (ref 1.7–7.7)
Neutrophils Relative %: 64 %
Platelets: 308 10*3/uL (ref 150–400)
RBC: 3.16 MIL/uL — ABNORMAL LOW (ref 3.87–5.11)
RDW: 17.1 % — AB (ref 11.5–15.5)
WBC: 6.7 10*3/uL (ref 4.0–10.5)

## 2016-01-14 LAB — URINE MICROSCOPIC-ADD ON

## 2016-01-14 LAB — URINALYSIS, ROUTINE W REFLEX MICROSCOPIC
Bilirubin Urine: NEGATIVE
GLUCOSE, UA: NEGATIVE mg/dL
Hgb urine dipstick: NEGATIVE
KETONES UR: NEGATIVE mg/dL
NITRITE: NEGATIVE
PROTEIN: NEGATIVE mg/dL
Specific Gravity, Urine: 1.02 (ref 1.005–1.030)
pH: 6 (ref 5.0–8.0)

## 2016-01-14 LAB — TSH: TSH: 1.622 u[IU]/mL (ref 0.350–4.500)

## 2016-01-14 LAB — TROPONIN I

## 2016-01-14 MED ORDER — SODIUM CHLORIDE 0.9 % IV SOLN
INTRAVENOUS | Status: DC
  Administered 2016-01-14: 19:00:00 via INTRAVENOUS

## 2016-01-14 MED ORDER — LOSARTAN POTASSIUM 50 MG PO TABS
100.0000 mg | ORAL_TABLET | Freq: Every day | ORAL | Status: DC
  Administered 2016-01-14 – 2016-01-17 (×4): 100 mg via ORAL
  Filled 2016-01-14 (×4): qty 2

## 2016-01-14 MED ORDER — DICLOFENAC SODIUM 1 % TD GEL
2.0000 g | Freq: Four times a day (QID) | TRANSDERMAL | Status: DC
  Administered 2016-01-15 – 2016-01-16 (×6): 2 g via TOPICAL
  Filled 2016-01-14: qty 100

## 2016-01-14 MED ORDER — ACETAMINOPHEN 650 MG RE SUPP: 650.0000 mg | Freq: Four times a day (QID) | RECTAL | Status: DC | PRN

## 2016-01-14 MED ORDER — DOCUSATE SODIUM 100 MG PO CAPS
100.0000 mg | ORAL_CAPSULE | Freq: Two times a day (BID) | ORAL | Status: DC
Start: 2016-01-14 — End: 2016-01-17
  Administered 2016-01-14 – 2016-01-17 (×6): 100 mg via ORAL
  Filled 2016-01-14 (×7): qty 1

## 2016-01-14 MED ORDER — DONEPEZIL HCL 5 MG PO TABS
10.0000 mg | ORAL_TABLET | Freq: Every day | ORAL | Status: DC
  Administered 2016-01-14 – 2016-01-16 (×3): 10 mg via ORAL
  Filled 2016-01-14 (×3): qty 2

## 2016-01-14 MED ORDER — ACETAMINOPHEN 325 MG PO TABS: 650.0000 mg | ORAL_TABLET | Freq: Four times a day (QID) | ORAL | Status: DC | PRN

## 2016-01-14 MED ORDER — ONDANSETRON HCL 4 MG/2ML IJ SOLN: 4.0000 mg | Freq: Four times a day (QID) | INTRAMUSCULAR | Status: DC | PRN

## 2016-01-14 MED ORDER — LOSARTAN POTASSIUM-HCTZ 100-25 MG PO TABS: 1.0000 | ORAL_TABLET | Freq: Every day | ORAL | Status: DC

## 2016-01-14 MED ORDER — ONDANSETRON HCL 4 MG PO TABS: 4.0000 mg | ORAL_TABLET | Freq: Four times a day (QID) | ORAL | Status: DC | PRN

## 2016-01-14 MED ORDER — HYDROCHLOROTHIAZIDE 25 MG PO TABS
25.0000 mg | ORAL_TABLET | Freq: Every day | ORAL | Status: DC
  Administered 2016-01-14 – 2016-01-17 (×4): 25 mg via ORAL
  Filled 2016-01-14 (×4): qty 1

## 2016-01-14 MED ORDER — SODIUM CHLORIDE 0.9% FLUSH
3.0000 mL | Freq: Two times a day (BID) | INTRAVENOUS | Status: DC
  Administered 2016-01-16: 3 mL via INTRAVENOUS

## 2016-01-14 MED ORDER — PANTOPRAZOLE SODIUM 40 MG PO TBEC
40.0000 mg | DELAYED_RELEASE_TABLET | Freq: Every day | ORAL | Status: DC
  Administered 2016-01-15 – 2016-01-17 (×3): 40 mg via ORAL
  Filled 2016-01-14 (×3): qty 1

## 2016-01-14 MED ORDER — ENOXAPARIN SODIUM 40 MG/0.4ML ~~LOC~~ SOLN
40.0000 mg | SUBCUTANEOUS | Status: DC
  Administered 2016-01-14 – 2016-01-16 (×3): 40 mg via SUBCUTANEOUS
  Filled 2016-01-14 (×2): qty 0.4

## 2016-01-14 MED ORDER — POTASSIUM CHLORIDE CRYS ER 20 MEQ PO TBCR
20.0000 meq | EXTENDED_RELEASE_TABLET | Freq: Every day | ORAL | Status: DC
  Administered 2016-01-15 – 2016-01-17 (×3): 20 meq via ORAL
  Filled 2016-01-14 (×3): qty 1

## 2016-01-14 MED ORDER — DILTIAZEM HCL ER BEADS 300 MG PO CP24: 300.0000 mg | ORAL_CAPSULE | Freq: Every day | ORAL | Status: DC

## 2016-01-14 MED ORDER — ASPIRIN 81 MG PO CHEW
324.0000 mg | CHEWABLE_TABLET | Freq: Once | ORAL | Status: DC
  Filled 2016-01-14 (×2): qty 4

## 2016-01-14 NOTE — ED Provider Notes (Signed)
CSN: [id number]     Arrival date & time 01/14/16  1014 History  By signing my name below, I, Altamease Oiler, attest that this documentation has been prepared under the direction and in the presence of Ezequiel Essex, MD. Electronically Signed: Altamease Oiler, ED Scribe. 01/14/2016. 11:18 AM   Chief Complaint  Patient presents with  . Altered Mental Status   Level V caveat due to dementia  The history is provided by the patient and a relative. No language interpreter was used.   Barbara [name] is a 82 y.o. female with history of alzheimer's disease and HTN who presents to the Emergency Department with her daughter complaining of chest pain today. Per daughter, the pt told her grandson that her heart hurt. After the complaint the pt leaned backwards and her eyes rolled back in her head but she did not lose consciousness. The patient does not remember having or complaining of any pain. Pt denies dizziness, nausea, SOB, diaphoresis or feeling hot, and any pain at present.     Past Medical History  Diagnosis Date  . Hypertension     High Cholesterol  . Alzheimer disease   . Arthritis    Past Surgical History  Procedure Laterality Date  . Orthopedic surgery Right     fractured ankle  . Abdominal surgery    . Dilation and curettage of uterus    . Cataract extraction w/phaco Right 05/17/2015    Procedure: CATARACT EXTRACTION PHACO AND INTRAOCULAR LENS PLACEMENT (IOC);  Surgeon: Rutherford Guys, MD;  Location: [facility];  Service: Ophthalmology;  Laterality: Right;  CDE:8.33  . Cataract extraction w/phaco Left 05/31/2015    Procedure: CATARACT EXTRACTION PHACO AND INTRAOCULAR LENS PLACEMENT (IOC);  Surgeon: Rutherford Guys, MD;  Location: [facility];  Service: Ophthalmology;  Laterality: Left;  CDE:8.80  . Colonoscopy N/A 10/28/2015    Procedure: COLONOSCOPY;  Surgeon: Rogene Houston, MD;  Location: [facility];  Service: Endoscopy;  Laterality: N/A;  730  . Esophagogastroduodenoscopy N/A 10/28/2015     Procedure: ESOPHAGOGASTRODUODENOSCOPY (EGD);  Surgeon: Rogene Houston, MD;  Location: [facility];  Service: Endoscopy;  Laterality: N/A;   History reviewed. No pertinent family history. Social History  Substance Use Topics  . Smoking status: Current Every Day Smoker -- 1.00 packs/day for 40 years    Types: Cigarettes  . Smokeless tobacco: None  . Alcohol Use: No   OB History    No data available     Review of Systems  Unable to perform ROS: Dementia      Allergies  Review of patient's allergies indicates no known allergies.  Home Medications   Prior to Admission medications   Medication Sig Start Date End Date Taking? Authorizing Provider  diclofenac sodium (VOLTAREN) 1 % GEL Apply 2 g topically 4 (four) times daily.   Yes Historical Provider, MD  diltiazem (TIAZAC) 300 MG 24 hr capsule Take 300 mg by mouth daily.   Yes Historical Provider, MD  docusate sodium (COLACE) 100 MG capsule Take 100 mg by mouth 2 (two) times daily.   Yes Historical Provider, MD  donepezil (ARICEPT) 10 MG tablet Take 10 mg by mouth at bedtime.   Yes Historical Provider, MD  losartan-hydrochlorothiazide (HYZAAR) 100-25 MG per tablet Take 1 tablet by mouth daily.   Yes Historical Provider, MD  pantoprazole (PROTONIX) 40 MG tablet Take 1 tablet (40 mg total) by mouth daily before breakfast. 10/28/15  Yes Rogene Houston, MD  Pediatric Multivitamins-Iron (FLINTSTONES PLUS IRON)  chewable tablet Chew 1 tablet by mouth 2 (two) times daily. 10/28/15  Yes Rogene Houston, MD  potassium chloride SA (K-DUR,KLOR-CON) 20 MEQ tablet Take 20 mEq by mouth daily.   Yes Historical Provider, MD   BP 112/42 mmHg  Pulse 54  Temp(Src) 97.6 F (36.4 C) (Oral)  Resp 18  SpO2 99% Physical Exam  Constitutional: She appears well-developed and well-nourished. No distress.  HENT:  Head: Normocephalic and atraumatic.  Mouth/Throat: Oropharynx is clear and moist. No oropharyngeal exudate.  Moist mucous membranes  Eyes:  Conjunctivae and EOM are normal. Pupils are equal, round, and reactive to light.  Neck: Normal range of motion. Neck supple.  No meningismus.  Cardiovascular: Normal rate, regular rhythm, normal heart sounds and intact distal pulses.   No murmur heard. Pulmonary/Chest: Effort normal and breath sounds normal. No respiratory distress.  Abdominal: Soft. There is no tenderness. There is no rebound and no guarding.  Musculoskeletal: Normal range of motion. She exhibits no edema or tenderness.  Neurological: She is alert. No cranial nerve deficit. She exhibits normal muscle tone. Coordination normal.  Cranial nerves II-XII intact. 5/5 strength throughout.  Oriented X 2  Skin: Skin is warm.  Psychiatric: She has a normal mood and affect. Her behavior is normal.  Nursing note and vitals reviewed.   ED Course  Procedures (including critical care time) DIAGNOSTIC STUDIES: Oxygen Saturation is 99% on RA,  normal by my interpretation.    COORDINATION OF CARE: 10:40 AM Discussed treatment plan  with pt and daughter at bedside and they agreed to plan.  Labs Review Labs Reviewed  CBC WITH DIFFERENTIAL/PLATELET - Abnormal; Notable for the following:    RBC 3.16 (*)    Hemoglobin 8.3 (*)    HCT 26.5 (*)    RDW 17.1 (*)    All other components within normal limits  COMPREHENSIVE METABOLIC PANEL - Abnormal; Notable for the following:    Glucose, Bld 134 (*)    BUN 22 (*)    Creatinine, Ser 1.45 (*)    Albumin 3.0 (*)    Total Bilirubin 0.1 (*)    GFR calc non Af Amer 32 (*)    GFR calc Af Amer 37 (*)    All other components within normal limits  URINALYSIS, ROUTINE W REFLEX MICROSCOPIC (NOT AT [facility]) - Abnormal; Notable for the following:    Leukocytes, UA MODERATE (*)    All other components within normal limits  URINE MICROSCOPIC-ADD ON - Abnormal; Notable for the following:    Squamous Epithelial / LPF 6-30 (*)    Bacteria, UA FEW (*)    All other components within normal limits   TROPONIN I    Imaging Review Dg Chest 2 View  01/14/2016  CLINICAL DATA:  Chest pain EXAM: CHEST  2 VIEW COMPARISON:  01/20/2012 FINDINGS: Heart and mediastinal contours are within normal limits. No focal opacities or effusions. No acute bony abnormality. IMPRESSION: No active cardiopulmonary disease. Electronically Signed   By: Rolm Baptise M.D.   On: 01/14/2016 11:32   Ct Head Wo Contrast  01/14/2016  CLINICAL DATA:  Acute mental status change. EXAM: CT HEAD WITHOUT CONTRAST TECHNIQUE: Contiguous axial images were obtained from the base of the skull through the vertex without intravenous contrast. COMPARISON:  January 20, 2012 FINDINGS: Paranasal sinuses, mastoid air cells, and bones are within normal limits. Extracranial soft tissues are normal. No subdural, epidural, or subarachnoid hemorrhage. Extensive calcification along the falx is stable. No acute cortical ischemia or  infarct. No mass, mass effect, or midline shift. The cerebellum, brainstem, and basal cisterns are patent. The ventricles and sulci are mildly prominent but stable. IMPRESSION: No acute intracranial process. Electronically Signed   By: Dorise Bullion III M.D   On: 01/14/2016 11:03   I have personally reviewed and evaluated these images and lab results as part of my medical decision-making.   EKG Interpretation   Date/Time:  Saturday January 14 2016 10:24:17 EST Ventricular Rate:  50 PR Interval:  385 QRS Duration: 113 QT Interval:  550 QTC Calculation: 502 R Axis:   -57 Text Interpretation:  Sinus rhythm Prolonged PR interval Incomplete RBBB  and LAFB Abnormal R-wave progression, late transition Probable left  ventricular hypertrophy Prolonged QT interval Nonspecific ST abnormality  nonspecific T wave inversions Confirmed by Wyvonnia Dusky  MD, Stan Cantave [phone]) on  01/14/2016 11:16:58 AM      MDM   Final diagnoses:  Chest pain, unspecified chest pain type  EKG abnormality   Dementia patient who reportedly complained  of chest pain earlier but does not recall the specifics. Denies any pain now. Daughter states she looked like she was going to pass out.  EKG with nonspecific ST changes and T-wave inversions,.  Chest x-ray negative. CT head negative. Patient at her baseline mental status confusion per family. Troponin is negative times one. She denies any further episodes of chest pain. Family states she cannot take aspirin. EKG does show new T-wave inversions inferior leads and anterior leads.  Observation admission discussed with Dr. Roderic Palau.   I personally performed the services described in this documentation, which was scribed in my presence. The recorded information has been reviewed and is accurate.    Ezequiel Essex, MD 01/14/16 [phone]

## 2016-01-14 NOTE — ED Notes (Signed)
No family here at present to provide history or event

## 2016-01-14 NOTE — Plan of Care (Signed)
Problem: Phase I Progression Outcomes Goal: Aspirin unless contraindicated Outcome: Not Met (add Reason) Patient refused - states that she was told not to take ASA d/t GI bleeding found on last colonoscopy

## 2016-01-14 NOTE — H&P (Signed)
[facility] History and Physical  Barbara [name] [id number] DOB: 1927-12-17 DOA: 01/14/2016  Referring physician: Dr. Wyvonnia Dusky, ER PCP: Asencion Noble, MD   Chief Complaint: chest pain  HPI: Barbara [name] is a 82 y.o. female who is in her usual state of health this morning. She had breakfast and subsequently reported chest pain. Daughter says that patient reported "my heart hurts". She did not have any shortness of breath, vomiting, lightheadedness, diaphoresis. After complaining of chest pain, the patient rolled her eyes back in her head and passed out. Daughter says the whole episode lasted approximately 30 minutes. The patient returned to her usual mental state without any postictal phase. She did not have any urinary or bowel incontinence. No jerking movements were reported. She's not had any recent fever, cough/congestion has not complained of any dysuria. She is brought to the register for evaluation and has been referred for admission for further evaluation of chest pain.   Review of Systems:  Pertinent positives as per HPI, otherwise negative   Past Medical History  Diagnosis Date  . Hypertension     High Cholesterol  . Alzheimer disease   . Arthritis    Past Surgical History  Procedure Laterality Date  . Orthopedic surgery Right     fractured ankle  . Abdominal surgery    . Dilation and curettage of uterus    . Cataract extraction w/phaco Right 05/17/2015    Procedure: CATARACT EXTRACTION PHACO AND INTRAOCULAR LENS PLACEMENT (IOC);  Surgeon: Rutherford Guys, MD;  Location: [facility];  Service: Ophthalmology;  Laterality: Right;  CDE:8.33  . Cataract extraction w/phaco Left 05/31/2015    Procedure: CATARACT EXTRACTION PHACO AND INTRAOCULAR LENS PLACEMENT (IOC);  Surgeon: Rutherford Guys, MD;  Location: [facility];  Service: Ophthalmology;  Laterality: Left;  CDE:8.80  . Colonoscopy N/A 10/28/2015    Procedure: COLONOSCOPY;  Surgeon: Rogene Houston, MD;  Location: [facility];   Service: Endoscopy;  Laterality: N/A;  730  . Esophagogastroduodenoscopy N/A 10/28/2015    Procedure: ESOPHAGOGASTRODUODENOSCOPY (EGD);  Surgeon: Rogene Houston, MD;  Location: [facility];  Service: Endoscopy;  Laterality: N/A;   Social History:  reports that she has been smoking Cigarettes.  She has a 40 pack-year smoking history. She does not have any smokeless tobacco history on file. She reports that she does not drink alcohol or use illicit drugs.  No Known Allergies  Family history: reviewed. No pertinent family history in this elderly patient  Prior to Admission medications   Medication Sig Start Date End Date Taking? Authorizing Provider  diclofenac sodium (VOLTAREN) 1 % GEL Apply 2 g topically 4 (four) times daily.   Yes Historical Provider, MD  diltiazem (TIAZAC) 300 MG 24 hr capsule Take 300 mg by mouth daily.   Yes Historical Provider, MD  docusate sodium (COLACE) 100 MG capsule Take 100 mg by mouth 2 (two) times daily.   Yes Historical Provider, MD  donepezil (ARICEPT) 10 MG tablet Take 10 mg by mouth at bedtime.   Yes Historical Provider, MD  losartan-hydrochlorothiazide (HYZAAR) 100-25 MG per tablet Take 1 tablet by mouth daily.   Yes Historical Provider, MD  pantoprazole (PROTONIX) 40 MG tablet Take 1 tablet (40 mg total) by mouth daily before breakfast. 10/28/15  Yes Rogene Houston, MD  Pediatric Multivitamins-Iron (FLINTSTONES PLUS IRON) chewable tablet Chew 1 tablet by mouth 2 (two) times daily. 10/28/15  Yes Rogene Houston, MD  potassium chloride SA (K-DUR,KLOR-CON) 20 MEQ tablet Take 20 mEq by  mouth daily.   Yes Historical Provider, MD   Physical Exam: Filed Vitals:   01/14/16 1204 01/14/16 1230 01/14/16 1300 01/14/16 1347  BP: 117/60 123/52 103/53 124/96  Pulse: 57 58 51 56  Temp:    98 F (36.7 C)  TempSrc:    Oral  Resp: 14 17 17 18   Height:    5\' 4"  (1.626 m)  Weight:    55.8 kg (123 lb 0.3 oz)  SpO2: 100% 98% 98% 100%    Wt Readings from Last 3  Encounters:  01/14/16 55.8 kg (123 lb 0.3 oz)  10/28/15 54.885 kg (121 lb)  05/31/15 55.339 kg (122 lb)    General:  Appears calm and comfortable Eyes: PERRL, normal lids, irises & conjunctiva ENT: grossly normal hearing, lips & tongue Neck: no LAD, masses or thyromegaly Cardiovascular: RRR, no m/r/g. No LE edema. Telemetry: SR, no arrhythmias  Respiratory: CTA bilaterally, no w/r/r. Normal respiratory effort. Abdomen: soft, ntnd Skin: no rash or induration seen on limited exam Musculoskeletal: grossly normal tone BUE/BLE Psychiatric: limited due to mental status Neurologic: grossly non-focal.          Labs on Admission:  Basic Metabolic Panel:  Recent Labs Lab 01/14/16 1041  NA 136  K 3.5  CL 104  CO2 25  GLUCOSE 134*  BUN 22*  CREATININE 1.45*  CALCIUM 9.0   Liver Function Tests:  Recent Labs Lab 01/14/16 1041  AST 17  ALT 14  ALKPHOS 68  BILITOT 0.1*  PROT 7.0  ALBUMIN 3.0*   No results for input(s): LIPASE, AMYLASE in the last 168 hours. No results for input(s): AMMONIA in the last 168 hours. CBC:  Recent Labs Lab 01/14/16 1041  WBC 6.7  NEUTROABS 4.3  HGB 8.3*  HCT 26.5*  MCV 83.9  PLT 308   Cardiac Enzymes:  Recent Labs Lab 01/14/16 1041  TROPONINI <0.03    BNP (last 3 results) No results for input(s): BNP in the last 8760 hours.  ProBNP (last 3 results) No results for input(s): PROBNP in the last 8760 hours.  CBG: No results for input(s): GLUCAP in the last 168 hours.  Radiological Exams on Admission: Dg Chest 2 View  01/14/2016  CLINICAL DATA:  Chest pain EXAM: CHEST  2 VIEW COMPARISON:  01/20/2012 FINDINGS: Heart and mediastinal contours are within normal limits. No focal opacities or effusions. No acute bony abnormality. IMPRESSION: No active cardiopulmonary disease. Electronically Signed   By: Rolm Baptise M.D.   On: 01/14/2016 11:32   Ct Head Wo Contrast  01/14/2016  CLINICAL DATA:  Acute mental status change. EXAM: CT  HEAD WITHOUT CONTRAST TECHNIQUE: Contiguous axial images were obtained from the base of the skull through the vertex without intravenous contrast. COMPARISON:  January 20, 2012 FINDINGS: Paranasal sinuses, mastoid air cells, and bones are within normal limits. Extracranial soft tissues are normal. No subdural, epidural, or subarachnoid hemorrhage. Extensive calcification along the falx is stable. No acute cortical ischemia or infarct. No mass, mass effect, or midline shift. The cerebellum, brainstem, and basal cisterns are patent. The ventricles and sulci are mildly prominent but stable. IMPRESSION: No acute intracranial process. Electronically Signed   By: Dorise Bullion III M.D   On: 01/14/2016 11:03    EKG: Independently reviewed. Possible T wave inversions in ant-inf leads, likely old  Assessment/Plan Active Problems:   Chest pain   Syncope   AKI (acute kidney injury) ([place])   Dehydration   Anemia   1. Chest pain. Patient  had episode of chest pain prior to admission. She does not have any recollection of the event. Cardiac enzymes have thus far been negative. EKG shows possible T wave inversions in anterolateral inferior leads, but these may be old. Will repeat EKG in a.m. Check echocardiogram. If workup is otherwise unremarkable, can consider outpatient cardiology follow-up. 2. Syncope. Will monitor on telemetry. She is mildly bradycardic. If heart rate drops less than 50, consider lowering diltiazem dosing. Urinalysis does not show any clear signs of infection. Check TSH and echo. 3. AK I . likely related to dehydration. Will give IV fluids and recheck labs in a.m. 4. Dehydration. Likely contributing to syncope. Will give IV fluids 5. Anemia. Chronic . possibly related to chronic blood loss per daughter. Continue to follow for now. 6. Dementia. Appears to be at baseline    Code Status: DNR DVT Prophylaxis: lovenox Family Communication: discussed with daughter Disposition Plan: discharge  home once improved  Time spent: 62mins  Sharlena Kristensen [facility] Pager [phone]

## 2016-01-14 NOTE — ED Notes (Signed)
Per EMS, called out for pt having chest pain. Pt denies chest pain. States she walked to the bathroom but does not know what happened.

## 2016-01-15 ENCOUNTER — Observation Stay (HOSPITAL_COMMUNITY): Payer: Medicare Other

## 2016-01-15 DIAGNOSIS — D649 Anemia, unspecified: Secondary | ICD-10-CM | POA: Diagnosis not present

## 2016-01-15 DIAGNOSIS — F17210 Nicotine dependence, cigarettes, uncomplicated: Secondary | ICD-10-CM | POA: Diagnosis not present

## 2016-01-15 DIAGNOSIS — R079 Chest pain, unspecified: Secondary | ICD-10-CM | POA: Diagnosis not present

## 2016-01-15 DIAGNOSIS — I6523 Occlusion and stenosis of bilateral carotid arteries: Secondary | ICD-10-CM | POA: Diagnosis not present

## 2016-01-15 DIAGNOSIS — F0390 Unspecified dementia without behavioral disturbance: Secondary | ICD-10-CM | POA: Diagnosis not present

## 2016-01-15 DIAGNOSIS — R9431 Abnormal electrocardiogram [ECG] [EKG]: Secondary | ICD-10-CM | POA: Diagnosis not present

## 2016-01-15 DIAGNOSIS — G309 Alzheimer's disease, unspecified: Secondary | ICD-10-CM | POA: Diagnosis not present

## 2016-01-15 DIAGNOSIS — R55 Syncope and collapse: Secondary | ICD-10-CM | POA: Diagnosis not present

## 2016-01-15 LAB — CBC
HCT: 25.8 % — ABNORMAL LOW (ref 36.0–46.0)
Hemoglobin: 8.5 g/dL — ABNORMAL LOW (ref 12.0–15.0)
MCH: 27.2 pg (ref 26.0–34.0)
MCHC: 32.9 g/dL (ref 30.0–36.0)
MCV: 82.7 fL (ref 78.0–100.0)
PLATELETS: 351 10*3/uL (ref 150–400)
RBC: 3.12 MIL/uL — AB (ref 3.87–5.11)
RDW: 17 % — ABNORMAL HIGH (ref 11.5–15.5)
WBC: 4.2 10*3/uL (ref 4.0–10.5)

## 2016-01-15 LAB — BASIC METABOLIC PANEL
ANION GAP: 7 (ref 5–15)
BUN: 19 mg/dL (ref 6–20)
CO2: 25 mmol/L (ref 22–32)
Calcium: 8.8 mg/dL — ABNORMAL LOW (ref 8.9–10.3)
Chloride: 104 mmol/L (ref 101–111)
Creatinine, Ser: 1.16 mg/dL — ABNORMAL HIGH (ref 0.44–1.00)
GFR calc Af Amer: 48 mL/min — ABNORMAL LOW (ref >60)
GFR, EST NON AFRICAN AMERICAN: 42 mL/min — AB (ref >60)
Glucose, Bld: 84 mg/dL (ref 65–99)
Potassium: 4.3 mmol/L (ref 3.5–5.1)
SODIUM: 136 mmol/L (ref 135–145)

## 2016-01-15 LAB — TROPONIN I

## 2016-01-15 MED ORDER — ATORVASTATIN CALCIUM 40 MG PO TABS
80.0000 mg | ORAL_TABLET | Freq: Every day | ORAL | Status: AC
  Administered 2016-01-15: 80 mg via ORAL
  Filled 2016-01-15: qty 2

## 2016-01-15 MED ORDER — DILTIAZEM HCL ER COATED BEADS 240 MG PO CP24
240.0000 mg | ORAL_CAPSULE | Freq: Every day | ORAL | Status: DC
  Administered 2016-01-15: 240 mg via ORAL
  Filled 2016-01-15: qty 1

## 2016-01-15 NOTE — Progress Notes (Signed)
*  PRELIMINARY RESULTS* Echocardiogram 2D Echocardiogram has been performed.  Barbara [name] 01/15/2016, 10:08 AM

## 2016-01-15 NOTE — Progress Notes (Signed)
Notified Dr. Maudie Mercury that the patient has not had IVF going since last night.  Voiced to him that the patient keeps pulling out the site.  The family voiced some concern about her getting a new one.  He states that we can leave the IV out for now.  The patient is drinking some and is documented that she ate 50% of her meals today.

## 2016-01-15 NOTE — Progress Notes (Signed)
Pt pulled IV out twice this shift.  Md  notified and responded to leave it out tonight.  Dr Willey Blade is scheduled to see her later this morning .

## 2016-01-15 NOTE — Progress Notes (Addendum)
Subjective: Near Syncope: pt denies further symptoms. Pt was apparently eating breakfast when her eyes rolled back and mouth opened.  Lasted for a few seconds.  Pt c/o slight chest pain. History is limited by dementia.  Pt was brought to ER and admitted for evaluation of ? Syncope and chest pain.   Cp:  Resolved,  Apparently lasted about 30 minutes but pt can't recall this.  No radiation of the pain.  Pt denies fever, chills, cough, sob.  Once again history is limited by her dementia.  Anemia:  Pt denies brbpr, black stool Renal mass,  Pt has MRI scheduled, family requests that it be done while in the hospital if possible.  AKI: secondary to dehydration,  Pt will received slight iv fluid.  Bradycardia:  Pt is on cardizem.  We will try to cut the dose back.   Objective: Vital signs in last 24 hours: Temp:  [97.3 F (36.3 C)-98 F (36.7 C)] 97.3 F (36.3 C) (02/25 2011) Pulse Rate:  [51-65] 65 (02/25 2011) Resp:  [14-20] 20 (02/25 2011) BP: (103-146)/(42-96) 146/59 mmHg (02/25 2011) SpO2:  [98 %-100 %] 100 % (02/25 2011) Weight:  [55.8 kg (123 lb 0.3 oz)] 55.8 kg (123 lb 0.3 oz) (02/25 1347) Weight change:  Last BM Date: 01/14/16  Intake/Output from previous day: 02/25 0701 - 02/26 0700 In: 240 [P.O.:240] Out: 400 [Urine:400] Intake/Output this shift:    Heent: anicteric, eomi, pupils 1.23mm symmetric,  Neck: nojvd Heart: rrr s1, s2 Lung: ctab Abd: soft Ext: no c/c/e Skin: no rash Lymph: no adenopathy Neuro: cn2-12 intact, reflexes 2+ symmetric, diffuse with downgoing toes bilaterally, motor 5/5 in all 4 ext  Lab Results:  Recent Labs  01/14/16 1041 01/15/16 0556  WBC 6.7 4.2  HGB 8.3* 8.5*  HCT 26.5* 25.8*  PLT 308 351   BMET  Recent Labs  01/14/16 1041 01/15/16 0556  NA 136 136  K 3.5 4.3  CL 104 104  CO2 25 25  GLUCOSE 134* 84  BUN 22* 19  CREATININE 1.45* 1.16*  CALCIUM 9.0 8.8*    Studies/Results: Dg Chest 2 View  01/14/2016  CLINICAL DATA:  Chest  pain EXAM: CHEST  2 VIEW COMPARISON:  01/20/2012 FINDINGS: Heart and mediastinal contours are within normal limits. No focal opacities or effusions. No acute bony abnormality. IMPRESSION: No active cardiopulmonary disease. Electronically Signed   By: Rolm Baptise M.D.   On: 01/14/2016 11:32   Ct Head Wo Contrast  01/14/2016  CLINICAL DATA:  Acute mental status change. EXAM: CT HEAD WITHOUT CONTRAST TECHNIQUE: Contiguous axial images were obtained from the base of the skull through the vertex without intravenous contrast. COMPARISON:  January 20, 2012 FINDINGS: Paranasal sinuses, mastoid air cells, and bones are within normal limits. Extracranial soft tissues are normal. No subdural, epidural, or subarachnoid hemorrhage. Extensive calcification along the falx is stable. No acute cortical ischemia or infarct. No mass, mass effect, or midline shift. The cerebellum, brainstem, and basal cisterns are patent. The ventricles and sulci are mildly prominent but stable. IMPRESSION: No acute intracranial process. Electronically Signed   By: Dorise Bullion III M.D   On: 01/14/2016 11:03    Medications: I have reviewed the patient's current medications.  Assessment/Plan: Cp Tele Cardiology consultation Cont aspirin,  Lipitor 80mg  po x1 while awaiting cardiology consultation and cardiac echo results  Near syncope/ syncope Tele Decrease cardizem from 300=> 240mg  po qday Check carotid ultrasound Check orthostatic bp   Anemia Check cbc in am,  Check ferritin b12, folate, spep, upep  Renal mass MRI abdomen  Renal insufficiency , almost resolved, gentle iv hydration today.   Dementia Cont aricept  DVT prophylaxis: scd, lovenox      Jani Gravel 01/15/2016, 8:13 AM

## 2016-01-16 ENCOUNTER — Observation Stay (HOSPITAL_COMMUNITY): Payer: Medicare Other

## 2016-01-16 ENCOUNTER — Encounter (HOSPITAL_COMMUNITY): Payer: Self-pay | Admitting: Adult Health

## 2016-01-16 DIAGNOSIS — F0390 Unspecified dementia without behavioral disturbance: Secondary | ICD-10-CM

## 2016-01-16 DIAGNOSIS — D3501 Benign neoplasm of right adrenal gland: Secondary | ICD-10-CM | POA: Diagnosis not present

## 2016-01-16 DIAGNOSIS — R55 Syncope and collapse: Secondary | ICD-10-CM | POA: Diagnosis not present

## 2016-01-16 DIAGNOSIS — R001 Bradycardia, unspecified: Secondary | ICD-10-CM

## 2016-01-16 DIAGNOSIS — D649 Anemia, unspecified: Secondary | ICD-10-CM | POA: Diagnosis not present

## 2016-01-16 DIAGNOSIS — R072 Precordial pain: Secondary | ICD-10-CM | POA: Diagnosis not present

## 2016-01-16 DIAGNOSIS — R079 Chest pain, unspecified: Secondary | ICD-10-CM | POA: Diagnosis not present

## 2016-01-16 DIAGNOSIS — K8020 Calculus of gallbladder without cholecystitis without obstruction: Secondary | ICD-10-CM | POA: Diagnosis not present

## 2016-01-16 DIAGNOSIS — E042 Nontoxic multinodular goiter: Secondary | ICD-10-CM | POA: Diagnosis not present

## 2016-01-16 LAB — MAGNESIUM: Magnesium: 2.1 mg/dL (ref 1.7–2.4)

## 2016-01-16 MED ORDER — DILTIAZEM HCL ER COATED BEADS 180 MG PO CP24
180.0000 mg | ORAL_CAPSULE | Freq: Every day | ORAL | Status: DC
  Administered 2016-01-17: 180 mg via ORAL
  Filled 2016-01-16 (×2): qty 1

## 2016-01-16 NOTE — Consult Note (Signed)
CARDIOLOGY CONSULT NOTE   [name] MRN: [id number] DOB/AGE: [birth year] 80 y.o.  Admit Date: 01/14/2016 Requesting Physician: Asencion Noble MD Primary Physician: Asencion Noble, MD Consulting Cardiologist: Rozann Lesches MD Reason for Consultation: Chest pain  Clinical Summary Barbara [name] is an 82 y.o.female with history of hypertension, Alzheimer's disease, and arthritis, who was in Barbara usual state of health when she had sudden episode of reported chest discomfort and generalized unresponsiveness. She is a poor historian, and history is obtained from Barbara [name] who is Barbara main caretaker. Due to worsening dementia, she has been living with Barbara [name] for one month.   Apparently, Barbara [name] had just finished eating breakfast and was sitting in Barbara living room when she suddenly put Barbara hand to Barbara chest and stated that Barbara "heart hurt." Barbara [name], and saw that Barbara head tilted back, mouth open, staring upwards, but not responding. She did not pass out. Barbara [name] had grandson call EMS, and while doing so, Barbara [name] kept talking to Barbara mother and gave Barbara a small amount of water, which caused Barbara to respond again and return to normal posture. There was no witnessed seizure activity or incontinence. No diaphoresis, or dyspnea. After she was back to baseline, Barbara [name] stated that she needed to go to Barbara bathroom where she had diarrhea. EMS evaluated Barbara and brought to ER.   On arrival to ER, BP 112/42, HR 54, O2 Sat 99%, afebrile. She was found to be anemic with Hgb of 8.3, Hct 26.5. Potassium 3.5, creatinine 1.45, with glucose of 134. GFR 37. Troponin has been negative X 4. ECG revealed sinus bradycardia with prolonged PR interval, left anterior fascicular block, and incomplete right bundle branch block, otherwise nonspecific ST-T changes.. She was given ASA 324 mg and admitted to evaluate further.   Since admission, Barbara [name] denies any further chest pain. Barbara  [name] has not seen recurrence of symptoms leading to admission. She did say that Barbara [name] had a cardiac cath 30 + years ago, but was found to be normal and did not have need to follow up with cardiology.    No Known Allergies  Medications Scheduled Medications: . aspirin  324 mg Oral Once  . diclofenac sodium  2 g Topical QID  . diltiazem  240 mg Oral Daily  . docusate sodium  100 mg Oral BID  . donepezil  10 mg Oral QHS  . enoxaparin (LOVENOX) injection  40 mg Subcutaneous Q24H  . losartan  100 mg Oral Daily   And  . hydrochlorothiazide  25 mg Oral Daily  . pantoprazole  40 mg Oral QAC breakfast  . potassium chloride SA  20 mEq Oral Daily  . sodium chloride flush  3 mL Intravenous Q12H    PRN Medications: acetaminophen **OR** acetaminophen, ondansetron **OR** ondansetron (ZOFRAN) IV   Past Medical History  Diagnosis Date  . Essential hypertension   . Alzheimer disease   . Arthritis   . Esophageal stricture   . GERD (gastroesophageal reflux disease)   . Hyperlipidemia     Past Surgical History  Procedure Laterality Date  . Orthopedic surgery Right     Fractured ankle  . Abdominal surgery    . Dilation and curettage of uterus    . Cataract extraction w/phaco Right 05/17/2015    Procedure: CATARACT EXTRACTION PHACO AND INTRAOCULAR LENS PLACEMENT (IOC);  Surgeon: Rutherford Guys, MD;  Location: [facility];  Service: Ophthalmology;  Laterality: Right;  CDE:8.33  .  Cataract extraction w/phaco Left 05/31/2015    Procedure: CATARACT EXTRACTION PHACO AND INTRAOCULAR LENS PLACEMENT (IOC);  Surgeon: Rutherford Guys, MD;  Location: [facility];  Service: Ophthalmology;  Laterality: Left;  CDE:8.80  . Colonoscopy N/A 10/28/2015    Procedure: COLONOSCOPY;  Surgeon: Rogene Houston, MD;  Location: [facility];  Service: Endoscopy;  Laterality: N/A;  730  . Esophagogastroduodenoscopy N/A 10/28/2015    Procedure: ESOPHAGOGASTRODUODENOSCOPY (EGD);  Surgeon: Rogene Houston, MD;  Location: [facility];  Service: Endoscopy;  Laterality: N/A;    Family History  Problem Relation Age of Onset  . Heart attack Mother   . Cancer Brother   . Hypertension Mother     Social History Barbara [name] reports that she has been smoking Cigarettes.  She has a 40 pack-year smoking history. She does not have any smokeless tobacco history on file. Barbara [name] reports that she does not drink alcohol.  Review of Systems Complete review of systems unable to be obtained due to limited information able to be obtained from [name] in Barbara setting of Barbara dementia.  Physical Examination Blood pressure 120/50, pulse 72, temperature 97.7 F (36.5 C), temperature source Oral, resp. rate 16, height 5\' 4"  (1.626 m), weight 123 lb 0.3 oz (55.8 kg), SpO2 99 %.  Intake/Output Summary (Last 24 hours) at 01/16/16 0943 Last data filed at 01/15/16 1500  Gross per 24 hour  Intake    240 ml  Output    600 ml  Net   -360 ml    Telemetry: Sinus rhythm and bradycardia. Rare dropped beat.  GEN: Elderly woman, appears comfortable at rest. HEENT: Conjunctiva and lids normal, oropharynx clear. Neck: Supple, no elevated JVP or carotid bruits, no thyromegaly. Lungs: Clear to auscultation, nonlabored breathing at rest. Cardiac: Regular rate and rhythm, no S3 or significant systolic murmur, no pericardial rub. Abdomen: Soft, nontender, bowel sounds present, no guarding or rebound. Extremities: No pitting edema, distal pulses 2+. Skin: Warm and dry. Musculoskeletal: Mild kyphosis. Neuropsychiatric: Alert and oriented x1, affect grossly appropriate.  Prior Cardiac Testing/Procedures  1. Echocardiogram 01/15/2016 Left ventricle: Barbara cavity size was normal. Wall thickness was increased in a pattern of mild LVH. Systolic function was normal. Barbara estimated ejection fraction was 55%. Hypokinesis of Barbara inferolateral myocardium. Doppler parameters are consistent with abnormal left ventricular relaxation (grade 1  diastolic dysfunction). - Pulmonary arteries: PA peak pressure: 32 mm Hg (S).  Lab Results  Basic Metabolic Panel:  Recent Labs Lab 01/14/16 1041 01/15/16 0556  NA 136 136  K 3.5 4.3  CL 104 104  CO2 25 25  GLUCOSE 134* 84  BUN 22* 19  CREATININE 1.45* 1.16*  CALCIUM 9.0 8.8*    Liver Function Tests:  Recent Labs Lab 01/14/16 1041  AST 17  ALT 14  ALKPHOS 68  BILITOT 0.1*  PROT 7.0  ALBUMIN 3.0*    CBC:  Recent Labs Lab 01/14/16 1041 01/15/16 0556  WBC 6.7 4.2  NEUTROABS 4.3  --   HGB 8.3* 8.5*  HCT 26.5* 25.8*  MCV 83.9 82.7  PLT 308 351    Cardiac Enzymes:  Recent Labs Lab 01/14/16 1041 01/14/16 2034 01/15/16 0008 01/15/16 0556  TROPONINI <0.03 <0.03 <0.03 <0.03    Radiology: Dg Chest 2 View  01/14/2016  CLINICAL DATA:  Chest pain EXAM: CHEST  2 VIEW COMPARISON:  01/20/2012 FINDINGS: Heart and mediastinal contours are within normal limits. No focal opacities or effusions. No acute bony abnormality. IMPRESSION: No active cardiopulmonary  disease. Electronically Signed   By: Rolm Baptise M.D.   On: 01/14/2016 11:32   Ct Head Wo Contrast  01/14/2016  CLINICAL DATA:  Acute mental status change. EXAM: CT HEAD WITHOUT CONTRAST TECHNIQUE: Contiguous axial images were obtained from Barbara base of Barbara skull through Barbara vertex without intravenous contrast. COMPARISON:  January 20, 2012 FINDINGS: Paranasal sinuses, mastoid air cells, and bones are within normal limits. Extracranial soft tissues are normal. No subdural, epidural, or subarachnoid hemorrhage. Extensive calcification along Barbara falx is stable. No acute cortical ischemia or infarct. No mass, mass effect, or midline shift. Barbara cerebellum, brainstem, and basal cisterns are patent. Barbara ventricles and sulci are mildly prominent but stable. IMPRESSION: No acute intracranial process. Electronically Signed   By: Dorise Bullion III M.D   On: 01/14/2016 11:03   Mr Abdomen Wo Contrast  01/16/2016  CLINICAL  DATA:  Evaluate abnormal CT. Suspicious right renal lesion. EXAM: MRI ABDOMEN WITHOUT CONTRAST TECHNIQUE: Multiplanar multisequence MR imaging was performed without Barbara administration of intravenous contrast. COMPARISON:  11/03/2015 FINDINGS: Exam detail is diminished due to respiratory motion artifact. [name] was unable to follow breath hold commands. Additionally, multiple attempts were performed to gain intravenous access however [name] removed Barbara IV on multiple occasions. Lower chest:  No pleural fluid. Hepatobiliary: No suspicious liver abnormality identified. Stones noted within Barbara dependent portion of Barbara gallbladder. Barbara common bile duct measures up to 5 mm, image 18 of series 20. No choledocholithiasis identified. Pancreas: No pancreatic duct dilatation. T2 hyperintense structure within Barbara body of pancreas measures 1.2 cm, image 14 of series 20. Spleen: Negative Adrenals/Urinary Tract: Loss of signal within Barbara right adrenal nodule is identified compatible with benign adenoma. Normal left adrenal gland. Bilateral renal cysts are identified. Arising from Barbara upper pole of Barbara left kidney Barbara largest cyst measures 4.1 cm, image 8 of series 20. Within Barbara anterior cortex of Barbara right kidney there is a 2.2 cm structure corresponding to Barbara recently identified complex cystic kidney lesion, image number 20 of series 20. This is entirely T1 hypo intense and predominantly T2 hypo intense with some areas of T2 hyperintensity. Barbara unenhanced appearance favors a solid and cystic kidney lesion. Stomach/Bowel: Barbara stomach an Barbara visualized upper abdominal bowel loops are unremarkable. Vascular/Lymphatic: No aneurysm identified. No upper abdominal adenopathy. Other: No free fluid or fluid collections identified. Musculoskeletal: Normal signal from within Barbara bone marrow. IMPRESSION: 1. Exam detail is diminished due to lack of IV contrast material and respiratory motion artifact. 2. Barbara signal characteristics  associated with Barbara suspicious right kidney lesion may be compatible with a solid and cystic kidney mass such as a renal cell carcinoma. Urologic consultation is advised. 3. Benign right adrenal gland adenoma 4. Hepatic steatosis 5. Gallstones 6. Indeterminate cystic lesion within tail of pancreas. A single followup examination at 1 year is recommended, preferably with MRI. Electronically Signed   By: Kerby Moors M.D.   On: 01/16/2016 08:46   US Soft Tissue Head/neck  01/16/2016  CLINICAL DATA:  Left-sided thyroid nodule seen on recent carotid Doppler ultrasound. EXAM: THYROID ULTRASOUND TECHNIQUE: Ultrasound examination of Barbara thyroid gland and adjacent soft tissues was performed. COMPARISON:  Carotid Doppler ultrasound - 01/15/2016 FINDINGS: Right thyroid lobe Measurements: Slightly diminutive in size measuring 3.3 x 1.7 x 2.0 cm. There are several scattered punctate (sub 4 mm) anechoic / cystic nodules scattered within Barbara right lobe of Barbara thyroid. Left thyroid lobe Measurements: Slightly diminutive in size measuring 3.8 x 2.3 x  1.9 cm. Left, mid - 1.8 x 1.3 x 1.1 cm - anechoic with internal echogenic nodules with ring down artifact suggestive of colloid - compatible with Barbara dominant nodule seen on preceding carotid Doppler ultrasound. Isthmus Thickness: Borderline enlarged measuring 0.8 cm in diameter. Left, inferior, lateral - 0.6 x 0.6 x 0.5 cm - anechoic with eccentric echogenic nodule with ring down artifact suggestive of colloid Lymphadenopathy None visualized. IMPRESSION: Scattered bilateral benign-appearing thyroid nodules, none of which currently meet imaging criteria to recommend percutaneous sampling. This recommendation follows Barbara consensus statement: Management of Thyroid Nodules Detected at [place]: Society of Radiologists in [place]. Radiology 2005; [id number]. Electronically Signed   By: Sandi Mariscal M.D.   On: 01/16/2016 09:15   US Carotid  Bilateral  01/15/2016  CLINICAL DATA:  82 year old female with a history of syncope, altered mental status, dementia. Cardiovascular risk factors include hypertension, known coronary artery disease, hyperlipidemia, tobacco use. EXAM: BILATERAL CAROTID DUPLEX ULTRASOUND TECHNIQUE: Pearline Cables scale imaging, color Doppler and duplex ultrasound were performed of bilateral carotid and vertebral arteries in Barbara neck. COMPARISON:  CT 01/14/2016 FINDINGS: Criteria: Quantification of carotid stenosis is based on velocity parameters that correlate Barbara residual internal carotid diameter with NASCET-based stenosis levels, using Barbara diameter of Barbara distal internal carotid lumen as Barbara denominator for stenosis measurement. Barbara following velocity measurements were obtained: RIGHT ICA:  Systolic [id number] cm/sec, Diastolic 21 cm/sec CCA:  86 cm/sec SYSTOLIC ICA/CCA RATIO:  1.2 ECA:  92 cm/sec LEFT ICA:  Systolic 67 cm/sec, Diastolic 16 cm/sec CCA:  75 cm/sec SYSTOLIC ICA/CCA RATIO:  0.9 ECA:  77 cm/sec Right Brachial SBP: Not acquired Left Brachial SBP: Not acquired RIGHT CAROTID ARTERY: Atherosclerotic changes of Barbara right common carotid artery with scattered calcified plaque. Intermediate waveform maintained. Heterogeneous and partially calcified plaque at Barbara right carotid bifurcation. Low resistance waveform of Barbara right ICA. Tortuosity of Barbara ICA. RIGHT VERTEBRAL ARTERY: Antegrade flow with low resistance waveform. LEFT CAROTID ARTERY: Atherosclerotic changes of Barbara left common carotid artery with scattered calcified plaque. Intermediate waveform maintained. Heterogeneous and partially calcified plaque of Barbara left carotid bifurcation. Tortuosity of Barbara left ICA. Low resistance ICA waveform. LEFT VERTEBRAL ARTERY:  Antegrade flow with low resistance waveform. Left thyroid nodule with internal calcifications/colloid. This measures 2.0 cm x 1.3 cm x 1.1 cm. IMPRESSION: Color duplex indicates moderate heterogeneous and calcified plaque, with  no hemodynamically significant stenosis by duplex criteria in Barbara extracranial cerebrovascular circulation. Incidental discovery of left thyroid nodule with internal calcifications. Given that Barbara ultrasound characteristics would meet criteria for biopsy, a dedicated ultrasound diagnostic study may be considered if pursuing a biopsy is warranted. Signed, Dulcy Fanny. Earleen Newport, DO [facility] Electronically Signed   By: Corrie Mckusick D.O.   On: 01/15/2016 11:26    ECG: Sinus bradycardia with prolonged PR interval, left anterior fascicular block, incomplete right bundle branch block, nonspecific ST-T changes.  Impression and Recommendations  1.Chest Pain: Transient episode at home, no recurrence under observation. ECG nonspecific and normal troponin X 3, arguing against ACS. Echo reveal normal EF with hypokinesis of Barbara inferolateral myocardium and grade I diastolic dysfunction. She has had no further episodes of pain. Due to age, and co-morbidities, along with normal troponin, would not pursue ischemic work up at this time, and continue to treat medically.  2. Unresponsiveness: Transient, apparently lasting approximately 1-5 minutes. She recovered to baseline after being given some water by Barbara [name]. Uncertain if this was related to  TIA vs arrhythmia. Carotid doppler was negative for hemodynamically significant stenosis with CT negative for acute intracranial process.   3. Hypertension: BP is currently well controlled on diltiazem.   4. Anemia: Noted on admission.with Hgb of 8.3 with follow up labs demonstrating 8.5. I do not see history of anemia but she is currently receiving iron replacement. Will defer to [name] for further work up.   Signed: Phill Myron. Lawrence NP [place]  01/16/2016, 9:43 AM Co-Sign MD   Attending note:  [name] seen and examined. Reviewed records and updated Barbara chart. Modified above note by Ms. Lawrence NP. Ms.  [name] presents after an episode of reported chest discomfort at rest followed by relative unresponsiveness although no loss of consciousness. Barbara symptoms were present anywhere from 30 seconds to a few minutes and resolved spontaneously. [name] has fairly significant dementia and does not provide any historical details. Barbara [name] and grandson were at home at Barbara time. Under hospital observation, she has not had any recurrent symptoms.  On examination today she appears comfortable at rest. Heart rate in Barbara 60s in sinus rhythm. Telemetry shows sinus bradycardia with slower heart rates, rare dropped beat. No obvious pauses or ventricular arrhythmias. Systolic blood pressure has been stable between 110 120. No carotid bruits. Lungs are clear. Cardiac exam reveals RRR with soft systolic murmur. Troponin I levels are normal 4, hemoglobin 8.5, creatinine 1.5 down to 1.2. ECG reviewed and shows sinus bradycardia with prolonged PR interval, left anterior second block, incomplete right bundle branch block, and nonspecific ST-T changes. Head CT showed no acute findings. Right Dopplers revealed no obvious obstructive ICA disease. Echocardiogram shows LVEF 55% with inferolateral hypokinesis suggesting underlying CAD.  [name] presents after episode of chest discomfort at home. This could have potentially been anginal, but Barbara possibility of a transient bradyarrhythmia is also to be considered in light of evidence of underlying conduction system disease, and Barbara fact that she is on both Cardizem CD and Aricept. No evidence of ACS or other high risk features. I discussed Barbara situation with Barbara [name] and Barbara [name] present. Would recommend conservative medical management at this time rather than pursuing aggressive cardiac workup - [name] was in agreement. Would continue aspirin, suggest reducing Cardizem CD to 180 mg daily mainly to avoid bradycardia, Aricept dose could be cut back alternatively. She is also on  ARB with HCTZ, agree with potassium supplementation. Sublingual nitroglycerin can be used as needed. She will keep follow-up with [name] whom she has seen for many years.  Satira Sark, M.D., F.A.C.C.

## 2016-01-16 NOTE — Progress Notes (Signed)
Subjective: He denies chest pain. Cardiac enzymes are normal. She has had no recurrent syncope. Telemetry reveals sinus rhythm with first-degree block at 64 bpm.   Objective: Vital signs in last 24 hours: Filed Vitals:   01/14/16 2011 01/15/16 1545 01/15/16 2312 01/16/16 0404  BP: 146/59 120/59 113/49 120/50  Pulse: 65 79 65 72  Temp: 97.3 F (36.3 C) 97.6 F (36.4 C) 99.1 F (37.3 C) 97.7 F (36.5 C)  TempSrc: Oral  Oral Oral  Resp: 20 20 20 16   Height:      Weight:      SpO2: 100% 100% 100% 99%   Weight change:   Intake/Output Summary (Last 24 hours) at 01/16/16 0724 Last data filed at 01/15/16 1500  Gross per 24 hour  Intake    360 ml  Output    850 ml  Net   -490 ml    Physical Exam: Alert and sitting on the side of the bed. Dementia at baseline. Lungs reveal diminished breath sounds. Heart regular. Abdomen nontender. Extremities no edema.  Lab Results:   No results found for this or any previous visit (from the past 24 hour(s)).   ABGS No results for input(s): PHART, PO2ART, TCO2, HCO3 in the last 72 hours.  Invalid input(s): PCO2 CULTURES No results found for this or any previous visit (from the past 240 hour(s)). Studies/Results: Dg Chest 2 View  01/14/2016  CLINICAL DATA:  Chest pain EXAM: CHEST  2 VIEW COMPARISON:  01/20/2012 FINDINGS: Heart and mediastinal contours are within normal limits. No focal opacities or effusions. No acute bony abnormality. IMPRESSION: No active cardiopulmonary disease. Electronically Signed   By: Rolm Baptise M.D.   On: 01/14/2016 11:32   Ct Head Wo Contrast  01/14/2016  CLINICAL DATA:  Acute mental status change. EXAM: CT HEAD WITHOUT CONTRAST TECHNIQUE: Contiguous axial images were obtained from the base of the skull through the vertex without intravenous contrast. COMPARISON:  January 20, 2012 FINDINGS: Paranasal sinuses, mastoid air cells, and bones are within normal limits. Extracranial soft tissues are normal. No subdural,  epidural, or subarachnoid hemorrhage. Extensive calcification along the falx is stable. No acute cortical ischemia or infarct. No mass, mass effect, or midline shift. The cerebellum, brainstem, and basal cisterns are patent. The ventricles and sulci are mildly prominent but stable. IMPRESSION: No acute intracranial process. Electronically Signed   By: Dorise Bullion III M.D   On: 01/14/2016 11:03   US Carotid Bilateral  01/15/2016  CLINICAL DATA:  82 year old female with a history of syncope, altered mental status, dementia. Cardiovascular risk factors include hypertension, known coronary artery disease, hyperlipidemia, tobacco use. EXAM: BILATERAL CAROTID DUPLEX ULTRASOUND TECHNIQUE: Pearline Cables scale imaging, color Doppler and duplex ultrasound were performed of bilateral carotid and vertebral arteries in the neck. COMPARISON:  CT 01/14/2016 FINDINGS: Criteria: Quantification of carotid stenosis is based on velocity parameters that correlate the residual internal carotid diameter with NASCET-based stenosis levels, using the diameter of the distal internal carotid lumen as the denominator for stenosis measurement. The following velocity measurements were obtained: RIGHT ICA:  Systolic A999333 cm/sec, Diastolic 21 cm/sec CCA:  86 cm/sec SYSTOLIC ICA/CCA RATIO:  1.2 ECA:  92 cm/sec LEFT ICA:  Systolic 67 cm/sec, Diastolic 16 cm/sec CCA:  75 cm/sec SYSTOLIC ICA/CCA RATIO:  0.9 ECA:  77 cm/sec Right Brachial SBP: Not acquired Left Brachial SBP: Not acquired RIGHT CAROTID ARTERY: Atherosclerotic changes of the right common carotid artery with scattered calcified plaque. Intermediate waveform maintained. Heterogeneous and partially calcified plaque  at the right carotid bifurcation. Low resistance waveform of the right ICA. Tortuosity of the ICA. RIGHT VERTEBRAL ARTERY: Antegrade flow with low resistance waveform. LEFT CAROTID ARTERY: Atherosclerotic changes of the left common carotid artery with scattered calcified plaque.  Intermediate waveform maintained. Heterogeneous and partially calcified plaque of the left carotid bifurcation. Tortuosity of the left ICA. Low resistance ICA waveform. LEFT VERTEBRAL ARTERY:  Antegrade flow with low resistance waveform. Left thyroid nodule with internal calcifications/colloid. This measures 2.0 cm x 1.3 cm x 1.1 cm. IMPRESSION: Color duplex indicates moderate heterogeneous and calcified plaque, with no hemodynamically significant stenosis by duplex criteria in the extracranial cerebrovascular circulation. Incidental discovery of left thyroid nodule with internal calcifications. Given that the ultrasound characteristics would meet criteria for biopsy, a dedicated ultrasound diagnostic study may be considered if pursuing a biopsy is warranted. Signed, Dulcy Fanny. Earleen Newport, DO [facility] Radiology Electronically Signed   By: Corrie Mckusick D.O.   On: 01/15/2016 11:26   Micro Results: No results found for this or any previous visit (from the past 240 hour(s)). Studies/Results: Dg Chest 2 View  01/14/2016  CLINICAL DATA:  Chest pain EXAM: CHEST  2 VIEW COMPARISON:  01/20/2012 FINDINGS: Heart and mediastinal contours are within normal limits. No focal opacities or effusions. No acute bony abnormality. IMPRESSION: No active cardiopulmonary disease. Electronically Signed   By: Rolm Baptise M.D.   On: 01/14/2016 11:32   Ct Head Wo Contrast  01/14/2016  CLINICAL DATA:  Acute mental status change. EXAM: CT HEAD WITHOUT CONTRAST TECHNIQUE: Contiguous axial images were obtained from the base of the skull through the vertex without intravenous contrast. COMPARISON:  January 20, 2012 FINDINGS: Paranasal sinuses, mastoid air cells, and bones are within normal limits. Extracranial soft tissues are normal. No subdural, epidural, or subarachnoid hemorrhage. Extensive calcification along the falx is stable. No acute cortical ischemia or infarct. No mass, mass effect,  or midline shift. The cerebellum, brainstem, and basal cisterns are patent. The ventricles and sulci are mildly prominent but stable. IMPRESSION: No acute intracranial process. Electronically Signed   By: Dorise Bullion III M.D   On: 01/14/2016 11:03   US Carotid Bilateral  01/15/2016  CLINICAL DATA:  82 year old female with a history of syncope, altered mental status, dementia. Cardiovascular risk factors include hypertension, known coronary artery disease, hyperlipidemia, tobacco use. EXAM: BILATERAL CAROTID DUPLEX ULTRASOUND TECHNIQUE: Pearline Cables scale imaging, color Doppler and duplex ultrasound were performed of bilateral carotid and vertebral arteries in the neck. COMPARISON:  CT 01/14/2016 FINDINGS: Criteria: Quantification of carotid stenosis is based on velocity parameters that correlate the residual internal carotid diameter with NASCET-based stenosis levels, using the diameter of the distal internal carotid lumen as the denominator for stenosis measurement. The following velocity measurements were obtained: RIGHT ICA:  Systolic A999333 cm/sec, Diastolic 21 cm/sec CCA:  86 cm/sec SYSTOLIC ICA/CCA RATIO:  1.2 ECA:  92 cm/sec LEFT ICA:  Systolic 67 cm/sec, Diastolic 16 cm/sec CCA:  75 cm/sec SYSTOLIC ICA/CCA RATIO:  0.9 ECA:  77 cm/sec Right Brachial SBP: Not acquired Left Brachial SBP: Not acquired RIGHT CAROTID ARTERY: Atherosclerotic changes of the right common carotid artery with scattered calcified plaque. Intermediate waveform maintained. Heterogeneous and partially calcified plaque at the right carotid bifurcation. Low resistance waveform of the right ICA. Tortuosity of the ICA. RIGHT VERTEBRAL ARTERY: Antegrade flow with low resistance waveform. LEFT CAROTID ARTERY: Atherosclerotic changes of the left common carotid artery with scattered calcified plaque. Intermediate waveform maintained. Heterogeneous and partially  calcified plaque of the left carotid bifurcation. Tortuosity of the left ICA. Low  resistance ICA waveform. LEFT VERTEBRAL ARTERY:  Antegrade flow with low resistance waveform. Left thyroid nodule with internal calcifications/colloid. This measures 2.0 cm x 1.3 cm x 1.1 cm. IMPRESSION: Color duplex indicates moderate heterogeneous and calcified plaque, with no hemodynamically significant stenosis by duplex criteria in the extracranial cerebrovascular circulation. Incidental discovery of left thyroid nodule with internal calcifications. Given that the ultrasound characteristics would meet criteria for biopsy, a dedicated ultrasound diagnostic study may be considered if pursuing a biopsy is warranted. Signed, Dulcy Fanny. Earleen Newport, DO [facility] Radiology Electronically Signed   By: Corrie Mckusick D.O.   On: 01/15/2016 11:26   Medications:  I have reviewed the patient's current medications Scheduled Meds: . aspirin  324 mg Oral Once  . diclofenac sodium  2 g Topical QID  . diltiazem  240 mg Oral Daily  . docusate sodium  100 mg Oral BID  . donepezil  10 mg Oral QHS  . enoxaparin (LOVENOX) injection  40 mg Subcutaneous Q24H  . losartan  100 mg Oral Daily   And  . hydrochlorothiazide  25 mg Oral Daily  . pantoprazole  40 mg Oral QAC breakfast  . potassium chloride SA  20 mEq Oral Daily  . sodium chloride flush  3 mL Intravenous Q12H   Continuous Infusions:  PRN Meds:.acetaminophen **OR** acetaminophen, ondansetron **OR** ondansetron (ZOFRAN) IV   Assessment/Plan: #1. Chest pain. Troponins are normal. EKG reveals normal sinus rhythm with first-degree AV block with nonspecific T-wave changes. Cardiology was consulted. Echo reveals normal left ventricular function with an ejection fraction of 55% and inferolateral hypokinesis. Mild LVH. #2. Syncope. No recurrence. #3. Renal mass. MRI today. #4. Chronic kidney disease stage III. Stable. #5. Dementia. Continue Aricept. #6. Colon stricture. #7. Anemia. Stable. #8. Thyroid nodule.  Schedule ultrasound. Euthyroid. Active Problems:   Chest pain   Syncope   AKI (acute kidney injury) ([place])   Dehydration   Anemia       Barbara [name] 01/16/2016, 7:24 AM

## 2016-01-17 DIAGNOSIS — R079 Chest pain, unspecified: Secondary | ICD-10-CM | POA: Diagnosis not present

## 2016-01-17 DIAGNOSIS — R55 Syncope and collapse: Secondary | ICD-10-CM | POA: Diagnosis not present

## 2016-01-17 MED ORDER — DILTIAZEM HCL ER COATED BEADS 180 MG PO CP24: 180.0000 mg | ORAL_CAPSULE | Freq: Every day | ORAL | Status: AC

## 2016-01-17 NOTE — Progress Notes (Signed)
Pt discharged with all belongings and prescriptions. With her daughter.

## 2016-01-17 NOTE — Discharge Summary (Signed)
Physician Discharge Summary  Barbara [name] [id number] DOB: 16-Oct-1928 DOA: 01/14/2016   Admit date: 01/14/2016 Discharge date: 01/17/2016  Discharge Diagnoses:  Active Problems:   Chest pain   Syncope   AKI (acute kidney injury) ([place])   Dehydration   Anemia  renal mass Alzheimer's disease Peptic ulcer disease   Wt Readings from Last 3 Encounters:  01/14/16 123 lb 0.3 oz (55.8 kg)  10/28/15 121 lb (54.885 kg)  05/31/15 122 lb (55.339 kg)     Hospital Course:  This patient is an 82 year old female who was admitted with chest pain and syncope. EKG revealed normal sinus rhythm with nonspecific T-wave changes. Troponins were negative. Her echocardiogram revealed normal LV function with an EF of 55% with inferolateral hypokinesis. There was mild LVH. Chest x-ray revealed no infiltrate. CT scan of the brain revealed no evidence of acute infarct. She was seen in consultation by cardiology. Her diltiazem dose was decreased. Hypertension has remained controlled. No further workup will be pursued as she has remained asymptomatic with no recurrence of chest pain or syncope.  She has a stable anemia related to peptic ulcer disease. She is treated with pantoprazole. She has had a recent upper endoscopy. She is being treated with iron.  She underwent continued evaluation of her renal mass. She has solid and cystic components possibly consistent with renal cell carcinoma. We will plan to consult urology as an outpatient following discharge.  She underwent carotid ultrasound which revealed no hemodynamically significant stenosis. She was found to have an incidental thyroid nodule which was evaluated by ultrasound. She has nonspecific thyroid nodules present which do not meet criteria for biopsy. She is euthyroid.  Her dementia is stable on donepezil. She is well cared for at home by her daughter. Her condition at discharge is much improved. She is at her baseline neurologically. Lungs are clear.  Heart is regular. Abdomen is nontender.  She will be seen in follow-up in my office in one week. Her diltiazem ER dose is being reduced to 120 mg daily.   Discharge Instructions     Medication List    STOP taking these medications        diltiazem 300 MG 24 hr capsule  Commonly known as:  TIAZAC  Replaced by:  diltiazem 180 MG 24 hr capsule      TAKE these medications        diclofenac sodium 1 % Gel  Commonly known as:  VOLTAREN  Apply 2 g topically 4 (four) times daily.     diltiazem 180 MG 24 hr capsule  Commonly known as:  CARDIZEM CD  Take 1 capsule (180 mg total) by mouth daily.     docusate sodium 100 MG capsule  Commonly known as:  COLACE  Take 100 mg by mouth 2 (two) times daily.     donepezil 10 MG tablet  Commonly known as:  ARICEPT  Take 10 mg by mouth at bedtime.     FLINTSTONES PLUS IRON chewable tablet  Chew 1 tablet by mouth 2 (two) times daily.     losartan-hydrochlorothiazide 100-25 MG tablet  Commonly known as:  HYZAAR  Take 1 tablet by mouth daily.     pantoprazole 40 MG tablet  Commonly known as:  PROTONIX  Take 1 tablet (40 mg total) by mouth daily before breakfast.     potassium chloride SA 20 MEQ tablet  Commonly known as:  K-DUR,KLOR-CON  Take 20 mEq by mouth daily.  Jobany Montellano 01/17/2016

## 2016-01-20 ENCOUNTER — Ambulatory Visit (HOSPITAL_COMMUNITY): Admission: RE | Admit: 2016-01-20 | Payer: Medicare Other | Source: Ambulatory Visit

## 2016-01-23 DIAGNOSIS — N2889 Other specified disorders of kidney and ureter: Secondary | ICD-10-CM | POA: Diagnosis not present

## 2016-01-23 DIAGNOSIS — I700 Atherosclerosis of aorta: Secondary | ICD-10-CM | POA: Diagnosis not present

## 2016-01-23 DIAGNOSIS — R079 Chest pain, unspecified: Secondary | ICD-10-CM | POA: Diagnosis not present

## 2016-02-13 DIAGNOSIS — Z79899 Other long term (current) drug therapy: Secondary | ICD-10-CM | POA: Diagnosis not present

## 2016-02-13 DIAGNOSIS — D649 Anemia, unspecified: Secondary | ICD-10-CM | POA: Diagnosis not present

## 2016-02-20 DIAGNOSIS — I10 Essential (primary) hypertension: Secondary | ICD-10-CM | POA: Diagnosis not present

## 2016-02-20 DIAGNOSIS — D509 Iron deficiency anemia, unspecified: Secondary | ICD-10-CM | POA: Diagnosis not present

## 2016-02-20 DIAGNOSIS — K279 Peptic ulcer, site unspecified, unspecified as acute or chronic, without hemorrhage or perforation: Secondary | ICD-10-CM | POA: Diagnosis not present

## 2016-03-27 DIAGNOSIS — G309 Alzheimer's disease, unspecified: Secondary | ICD-10-CM | POA: Diagnosis not present

## 2016-03-27 DIAGNOSIS — Z79899 Other long term (current) drug therapy: Secondary | ICD-10-CM | POA: Diagnosis not present

## 2016-03-27 DIAGNOSIS — E785 Hyperlipidemia, unspecified: Secondary | ICD-10-CM | POA: Diagnosis not present

## 2016-03-27 DIAGNOSIS — I10 Essential (primary) hypertension: Secondary | ICD-10-CM | POA: Diagnosis not present

## 2016-03-27 DIAGNOSIS — M1990 Unspecified osteoarthritis, unspecified site: Secondary | ICD-10-CM | POA: Diagnosis not present

## 2016-04-03 DIAGNOSIS — I700 Atherosclerosis of aorta: Secondary | ICD-10-CM | POA: Diagnosis not present

## 2016-04-03 DIAGNOSIS — N183 Chronic kidney disease, stage 3 (moderate): Secondary | ICD-10-CM | POA: Diagnosis not present

## 2016-04-03 DIAGNOSIS — D509 Iron deficiency anemia, unspecified: Secondary | ICD-10-CM | POA: Diagnosis not present

## 2016-04-05 ENCOUNTER — Other Ambulatory Visit (INDEPENDENT_AMBULATORY_CARE_PROVIDER_SITE_OTHER): Payer: Self-pay | Admitting: Internal Medicine

## 2016-06-07 DIAGNOSIS — I10 Essential (primary) hypertension: Secondary | ICD-10-CM | POA: Diagnosis not present

## 2016-06-07 DIAGNOSIS — G309 Alzheimer's disease, unspecified: Secondary | ICD-10-CM | POA: Diagnosis not present

## 2016-06-07 DIAGNOSIS — D649 Anemia, unspecified: Secondary | ICD-10-CM | POA: Diagnosis not present

## 2016-07-13 IMAGING — CT CT ABD-PELV W/ CM
2 of 6 series · 15 of 46 positions shown, 17 images · IV contrast (Omnipaque 300)
Comparison: No priors.

CLINICAL DATA: 87-year-old female with history of sigmoid colon
stricture. Blood in the stool. History of Alzheimer's disease.

EXAM:
CT ABDOMEN AND PELVIS WITH CONTRAST
TECHNIQUE: Multidetector CT imaging of the abdomen and pelvis was performed
using the standard protocol following bolus administration of
intravenous contrast.
CONTRAST:  80mL OMNIPAQUE IOHEXOL 300 MG/ML  SOLN

[Series 2: abd_pel_with 5.0 b40f · axial · 0.63mm/px · z∈[+630,+976]mm · 12 of 81 slices shown, 14 images]
[im 6/81  soft-tissue]
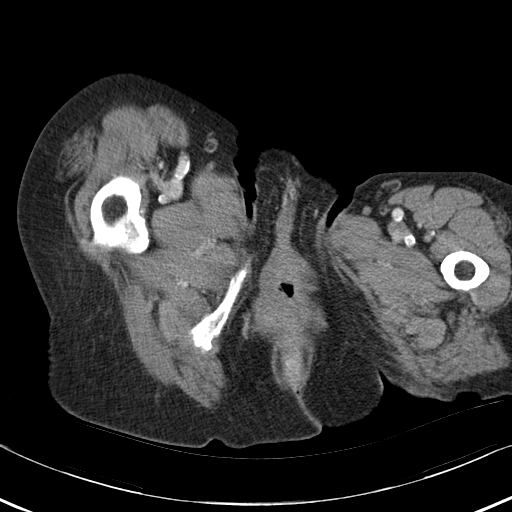
[im 6/81  bone]
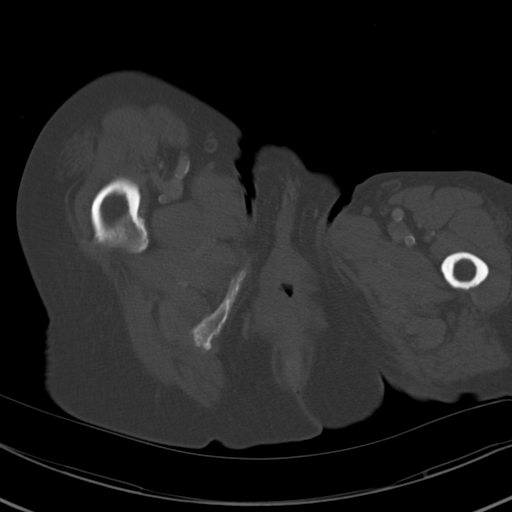
[im 11/81  soft-tissue]
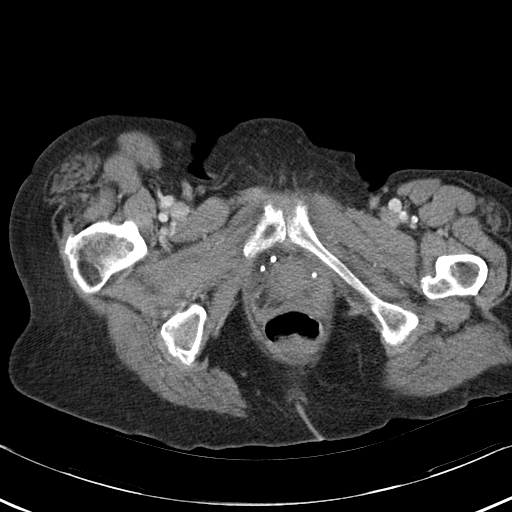
[im 17/81  soft-tissue]
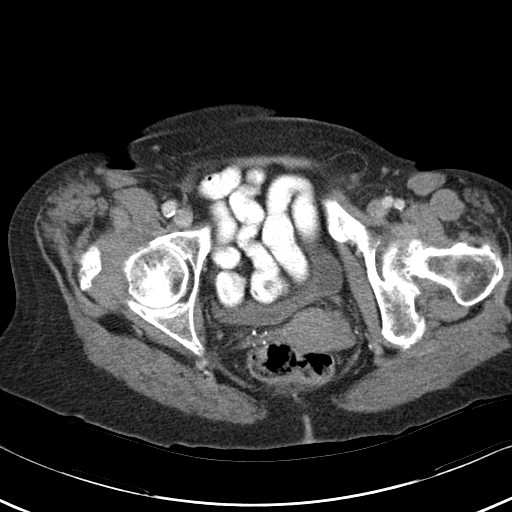
[im 27/81  soft-tissue]
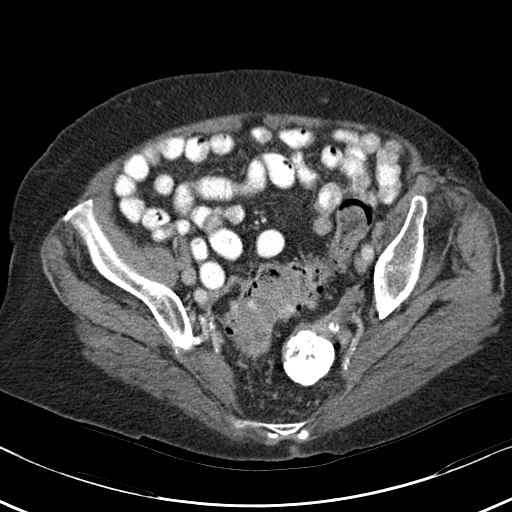
[im 33/81  soft-tissue]
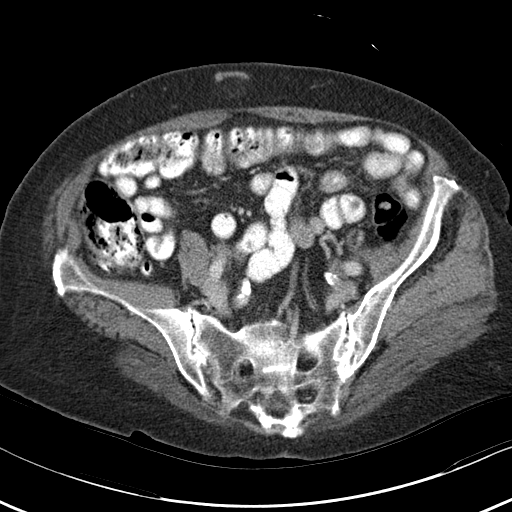
[im 38/81  soft-tissue]
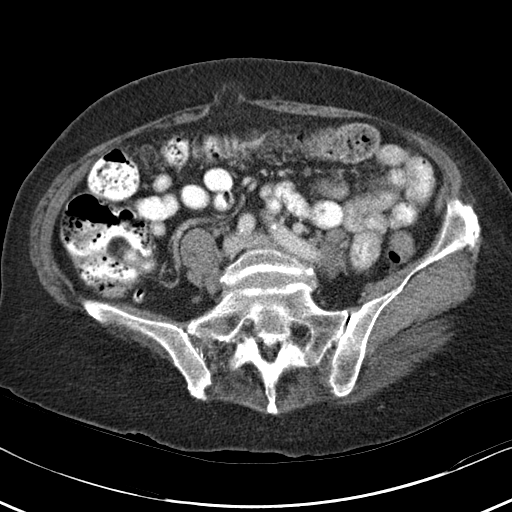
[im 43/81  soft-tissue]
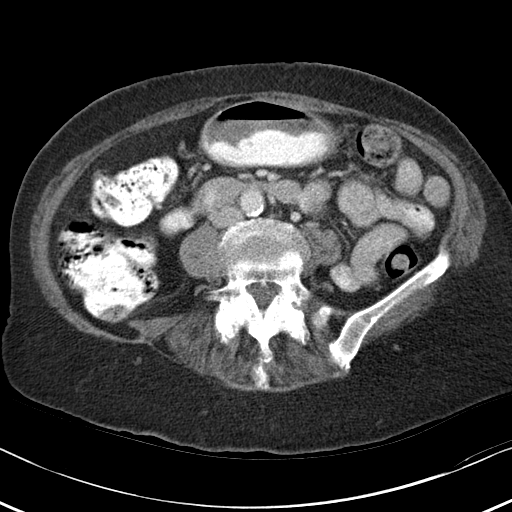
[im 49/81  soft-tissue]
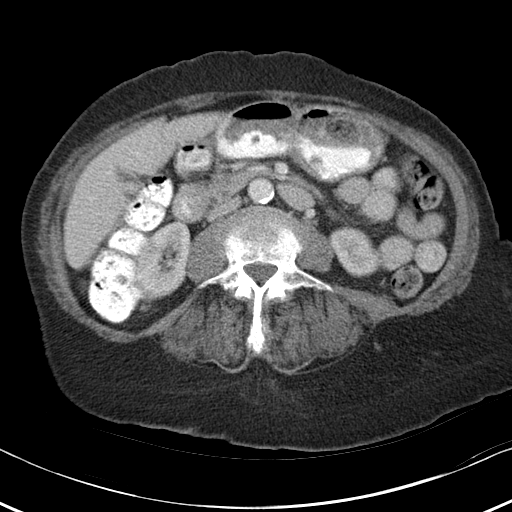
[im 54/81  soft-tissue]
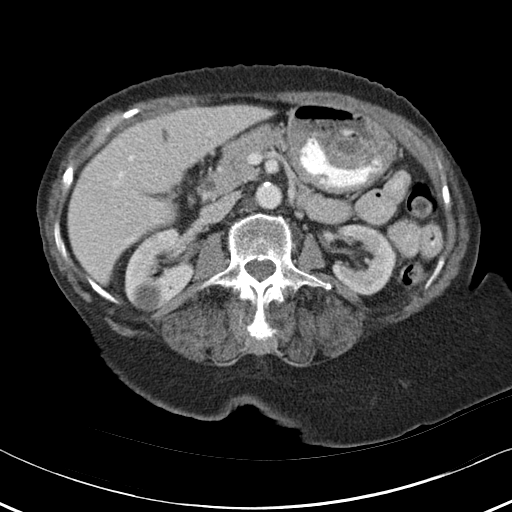
[im 54/81  bone]
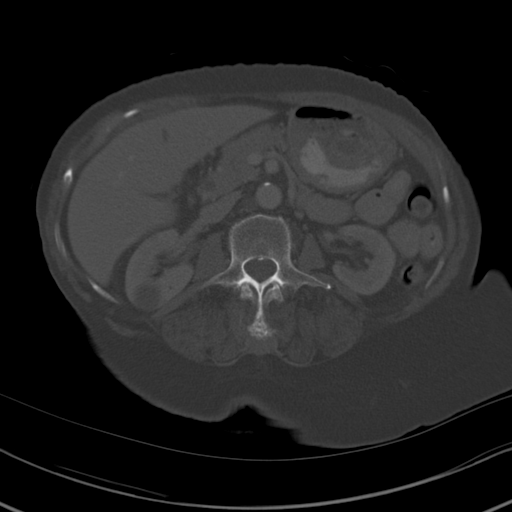
[im 65/81  soft-tissue]
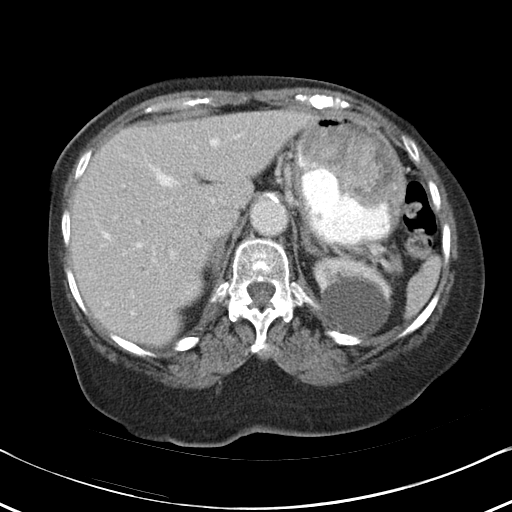
[im 70/81  soft-tissue]
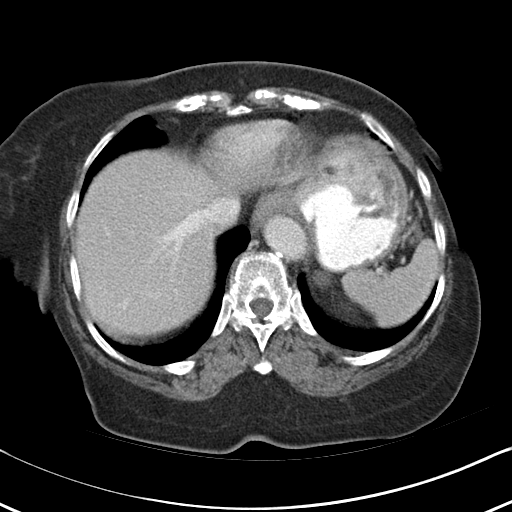
[im 75/81  soft-tissue]
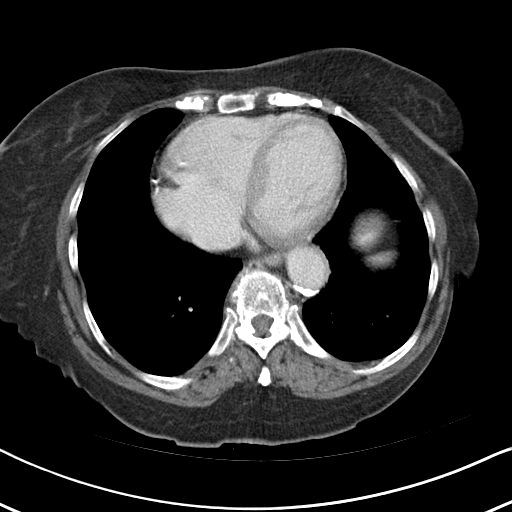

[Series 5: abd_pel_with 3.0 spo cor · coronal · 0.67mm/px · 3 of 77 slices shown]
[im 26/77  soft-tissue]
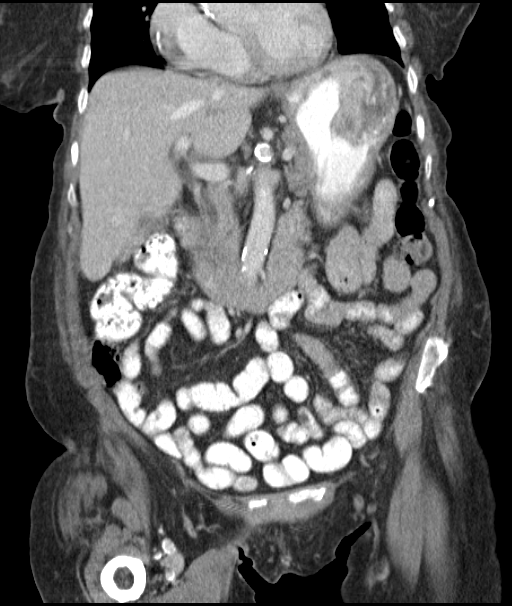
[im 34/77  soft-tissue]
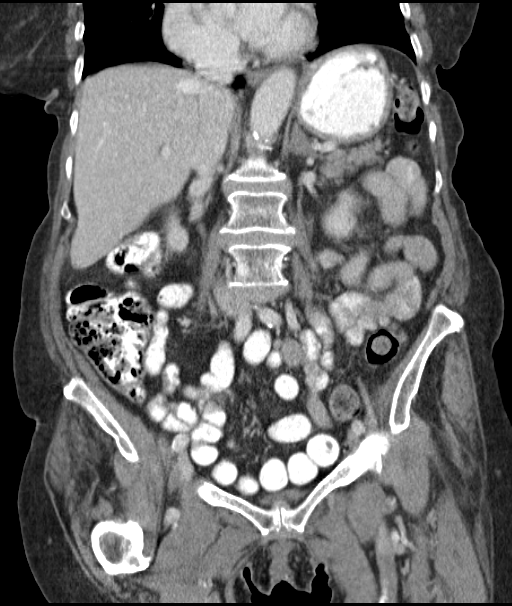
[im 43/77  soft-tissue]
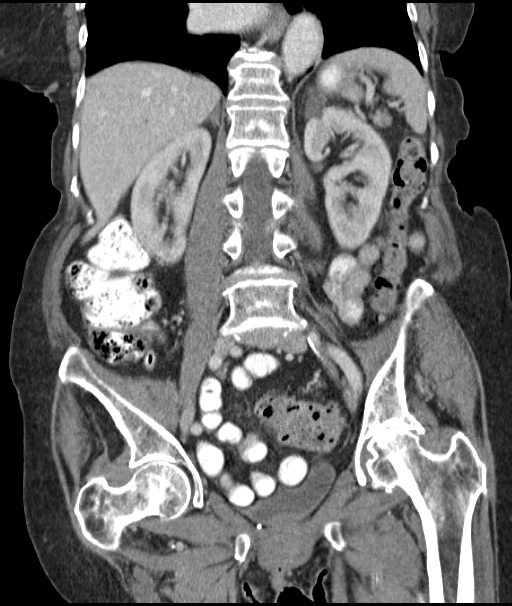

[15 of 46 positions shown; findings below may reference images not displayed]

FINDINGS: Lower chest: Atherosclerotic calcifications in the left anterior
descending and right coronary arteries.

Hepatobiliary: No suspicious cystic or solid hepatic lesions. No
intra or extrahepatic biliary ductal dilatation. Gallbladder is
nearly completely decompressed. Small amount of high attenuation
material lying dependently in the gallbladder may reflect biliary
sludge and/or small gallstones.

Pancreas: No pancreatic mass. No pancreatic ductal dilatation. No
pancreatic or peripancreatic fluid or inflammatory changes.

Spleen: Unremarkable.

Adrenals/Urinary Tract: In the anterior aspect of the interpolar
region of the right kidney there is a 1.9 x 1.5 cm lesion that
measures 61 HU on early postcontrast images, decreasing to 47 HU on
delayed post-contrast images, which appears to have some internal
architecture, highly concerning for a cystic renal neoplasm.
Multiple other low-attenuation lesions are noted in the kidneys
bilaterally, compatible with simple cysts, largest of which measures
4.3 cm in the upper pole of the left kidney. 1.5 cm indeterminate
nodule in the right adrenal gland. Thickening of the left adrenal
gland. No hydroureteronephrosis. Urinary bladder is nearly
decompressed, but otherwise unremarkable in appearance.

Stomach/Bowel: The appearance of the stomach is normal. No
pathologic dilatation of small bowel or colon. Numerous colonic
diverticulae are noted, most severe in the sigmoid colon, where
there is extensive colonic wall thickening, likely secondary to
chronic diverticular disease (underlying mass is difficult to
exclude), but no surrounding inflammatory changes to suggest an
acute diverticulitis at this time. Normal appendix.

Vascular/Lymphatic: Atherosclerosis throughout the abdominal and
pelvic vasculature, without evidence of aneurysm or dissection. No
lymphadenopathy noted in the abdomen or pelvis.

Reproductive: Uterus is atrophic. There are multiple densely
calcified uterine lesions, largest of which is in the fundus
measuring 4.3 cm in diameter, compatible with multiple calcified
fibroids. Ovaries are atrophic.

Other: Tiny umbilical hernia containing a small amount of omental
fat. No significant volume of ascites. No pneumoperitoneum.

Musculoskeletal: There are no aggressive appearing lytic or blastic
lesions noted in the visualized portions of the skeleton.
IMPRESSION: 1. Extensive colonic diverticulosis, most severe in the sigmoid
colon where there is severe colonic wall thickening. No findings to
suggest an acute diverticulitis at this time. If not recently
performed, correlation with colonoscopy is suggested to use exclude
the possibility of underlying malignancy.
2. 1.9 x 1.5 cm lesion in the anterior aspect of the interpolar
region of the right kidney, highly suspicious for small cystic renal
neoplasm. Correlation with nonemergent MRI of the abdomen with and
without IV gadolinium is recommended in the near future for
definitive characterization.
3. 1.5 cm indeterminate right adrenal nodule. Statistically, this is
likely to represent a small adenoma. Attention at time of followup
MRI of the abdomen is recommended for characterization of this
lesion.
4. Atherosclerosis, including at least 2 vessel coronary artery
disease.
5. Additional incidental findings, as above.

## 2016-07-19 ENCOUNTER — Other Ambulatory Visit (HOSPITAL_COMMUNITY): Payer: Self-pay | Admitting: Internal Medicine

## 2016-07-19 DIAGNOSIS — N2889 Other specified disorders of kidney and ureter: Secondary | ICD-10-CM

## 2016-08-02 ENCOUNTER — Ambulatory Visit (HOSPITAL_COMMUNITY)
Admission: RE | Admit: 2016-08-02 | Discharge: 2016-08-02 | Disposition: A | Discharge status: Home / Self Care | Payer: Medicare Other | Source: Ambulatory Visit | Attending: Internal Medicine | Admitting: Internal Medicine

## 2016-08-02 DIAGNOSIS — G301 Alzheimer's disease with late onset: Secondary | ICD-10-CM | POA: Diagnosis not present

## 2016-08-02 DIAGNOSIS — D649 Anemia, unspecified: Secondary | ICD-10-CM | POA: Diagnosis not present

## 2016-08-02 DIAGNOSIS — N2889 Other specified disorders of kidney and ureter: Secondary | ICD-10-CM | POA: Insufficient documentation

## 2016-08-02 DIAGNOSIS — K8020 Calculus of gallbladder without cholecystitis without obstruction: Secondary | ICD-10-CM | POA: Insufficient documentation

## 2016-08-02 DIAGNOSIS — Z79899 Other long term (current) drug therapy: Secondary | ICD-10-CM | POA: Diagnosis not present

## 2016-08-02 DIAGNOSIS — I10 Essential (primary) hypertension: Secondary | ICD-10-CM | POA: Diagnosis not present

## 2016-08-02 DIAGNOSIS — N281 Cyst of kidney, acquired: Secondary | ICD-10-CM | POA: Diagnosis not present

## 2016-08-02 LAB — POCT I-STAT CREATININE: CREATININE: 1.2 mg/dL — AB (ref 0.44–1.00)

## 2016-08-02 MED ORDER — GADOBENATE DIMEGLUMINE 529 MG/ML IV SOLN
10.0000 mL | Freq: Once | INTRAVENOUS | Status: AC | PRN
  Administered 2016-08-02: 10 mL via INTRAVENOUS

## 2016-08-09 ENCOUNTER — Telehealth (INDEPENDENT_AMBULATORY_CARE_PROVIDER_SITE_OTHER): Payer: Self-pay | Admitting: *Deleted

## 2016-08-09 DIAGNOSIS — N2889 Other specified disorders of kidney and ureter: Secondary | ICD-10-CM | POA: Diagnosis not present

## 2016-08-09 DIAGNOSIS — Z23 Encounter for immunization: Secondary | ICD-10-CM | POA: Diagnosis not present

## 2016-08-09 DIAGNOSIS — D509 Iron deficiency anemia, unspecified: Secondary | ICD-10-CM | POA: Diagnosis not present

## 2016-08-09 DIAGNOSIS — N183 Chronic kidney disease, stage 3 (moderate): Secondary | ICD-10-CM | POA: Diagnosis not present

## 2016-08-09 NOTE — Telephone Encounter (Signed)
This will be addressed with Dr.[name]. 

## 2016-08-09 NOTE — Telephone Encounter (Signed)
Patient's daughter stopped by office to see if Barbara [name] needs to continue taking pantoprazole 40 mg daily -- if so they need new prescription sent to [facility] daughter [name] @ [phone] and let her know

## 2016-08-13 ENCOUNTER — Other Ambulatory Visit (INDEPENDENT_AMBULATORY_CARE_PROVIDER_SITE_OTHER): Payer: Self-pay | Admitting: *Deleted

## 2016-08-13 MED ORDER — PANTOPRAZOLE SODIUM 40 MG PO TBEC: 40.0000 mg | DELAYED_RELEASE_TABLET | Freq: Every day | ORAL | 5 refills | Status: DC

## 2016-08-13 NOTE — Telephone Encounter (Signed)
The Rx has been sent to the patient's pharmacy and her daughter has been made aware.

## 2016-08-13 NOTE — Telephone Encounter (Signed)
Per Dr.[name] the should be taking the Pantoprazole. A new rx will be sent in to her pharmacy and her daughter will be made aware.

## 2016-12-03 DIAGNOSIS — E785 Hyperlipidemia, unspecified: Secondary | ICD-10-CM | POA: Diagnosis not present

## 2016-12-03 DIAGNOSIS — G309 Alzheimer's disease, unspecified: Secondary | ICD-10-CM | POA: Diagnosis not present

## 2016-12-03 DIAGNOSIS — Z79899 Other long term (current) drug therapy: Secondary | ICD-10-CM | POA: Diagnosis not present

## 2016-12-03 DIAGNOSIS — D509 Iron deficiency anemia, unspecified: Secondary | ICD-10-CM | POA: Diagnosis not present

## 2016-12-03 DIAGNOSIS — N183 Chronic kidney disease, stage 3 (moderate): Secondary | ICD-10-CM | POA: Diagnosis not present

## 2016-12-03 DIAGNOSIS — I10 Essential (primary) hypertension: Secondary | ICD-10-CM | POA: Diagnosis not present

## 2016-12-13 DIAGNOSIS — G309 Alzheimer's disease, unspecified: Secondary | ICD-10-CM | POA: Diagnosis not present

## 2016-12-13 DIAGNOSIS — N184 Chronic kidney disease, stage 4 (severe): Secondary | ICD-10-CM | POA: Diagnosis not present

## 2016-12-13 DIAGNOSIS — Z6821 Body mass index (BMI) 21.0-21.9, adult: Secondary | ICD-10-CM | POA: Diagnosis not present

## 2016-12-13 DIAGNOSIS — D509 Iron deficiency anemia, unspecified: Secondary | ICD-10-CM | POA: Diagnosis not present

## 2017-02-25 ENCOUNTER — Other Ambulatory Visit (INDEPENDENT_AMBULATORY_CARE_PROVIDER_SITE_OTHER): Payer: Self-pay | Admitting: Internal Medicine

## 2017-02-26 NOTE — Telephone Encounter (Signed)
Patient will need to have OV prior to further refills. She was last seen for a procedure in 2016. Patient may also get the Rx from her PCP.

## 2017-03-07 DIAGNOSIS — E785 Hyperlipidemia, unspecified: Secondary | ICD-10-CM | POA: Diagnosis not present

## 2017-03-07 DIAGNOSIS — Z79899 Other long term (current) drug therapy: Secondary | ICD-10-CM | POA: Diagnosis not present

## 2017-03-07 DIAGNOSIS — N183 Chronic kidney disease, stage 3 (moderate): Secondary | ICD-10-CM | POA: Diagnosis not present

## 2017-03-07 DIAGNOSIS — G309 Alzheimer's disease, unspecified: Secondary | ICD-10-CM | POA: Diagnosis not present

## 2017-03-07 DIAGNOSIS — I10 Essential (primary) hypertension: Secondary | ICD-10-CM | POA: Diagnosis not present

## 2017-03-14 DIAGNOSIS — Z6821 Body mass index (BMI) 21.0-21.9, adult: Secondary | ICD-10-CM | POA: Diagnosis not present

## 2017-03-14 DIAGNOSIS — N183 Chronic kidney disease, stage 3 (moderate): Secondary | ICD-10-CM | POA: Diagnosis not present

## 2017-03-14 DIAGNOSIS — I10 Essential (primary) hypertension: Secondary | ICD-10-CM | POA: Diagnosis not present

## 2017-04-26 ENCOUNTER — Other Ambulatory Visit (INDEPENDENT_AMBULATORY_CARE_PROVIDER_SITE_OTHER): Payer: Self-pay | Admitting: Internal Medicine

## 2017-04-29 NOTE — Telephone Encounter (Signed)
Patient will need appointment prior to further refills. She was last seen at the time of procedure 2017. Did not see a follow up visit.

## 2017-05-07 ENCOUNTER — Encounter (INDEPENDENT_AMBULATORY_CARE_PROVIDER_SITE_OTHER): Payer: Self-pay | Admitting: Internal Medicine

## 2017-05-07 NOTE — Telephone Encounter (Signed)
Patient was given an appointment for 05/21/17 at 10:00am with Deberah Castle, NP.  A letter was mailed to the patient.

## 2017-05-21 ENCOUNTER — Ambulatory Visit (INDEPENDENT_AMBULATORY_CARE_PROVIDER_SITE_OTHER): Payer: Medicare Other | Admitting: Internal Medicine

## 2017-06-13 DIAGNOSIS — N183 Chronic kidney disease, stage 3 (moderate): Secondary | ICD-10-CM | POA: Diagnosis not present

## 2017-06-13 DIAGNOSIS — I10 Essential (primary) hypertension: Secondary | ICD-10-CM | POA: Diagnosis not present

## 2017-06-13 DIAGNOSIS — G309 Alzheimer's disease, unspecified: Secondary | ICD-10-CM | POA: Diagnosis not present

## 2017-06-13 DIAGNOSIS — E785 Hyperlipidemia, unspecified: Secondary | ICD-10-CM | POA: Diagnosis not present

## 2017-06-13 DIAGNOSIS — Z79899 Other long term (current) drug therapy: Secondary | ICD-10-CM | POA: Diagnosis not present

## 2017-06-20 DIAGNOSIS — G309 Alzheimer's disease, unspecified: Secondary | ICD-10-CM | POA: Diagnosis not present

## 2017-06-20 DIAGNOSIS — I10 Essential (primary) hypertension: Secondary | ICD-10-CM | POA: Diagnosis not present

## 2017-06-20 DIAGNOSIS — N183 Chronic kidney disease, stage 3 (moderate): Secondary | ICD-10-CM | POA: Diagnosis not present

## 2017-07-03 ENCOUNTER — Other Ambulatory Visit (HOSPITAL_COMMUNITY): Payer: Self-pay | Admitting: Internal Medicine

## 2017-07-03 DIAGNOSIS — N289 Disorder of kidney and ureter, unspecified: Secondary | ICD-10-CM

## 2017-07-15 ENCOUNTER — Emergency Department (HOSPITAL_COMMUNITY): Payer: Medicare Other

## 2017-07-15 ENCOUNTER — Emergency Department (HOSPITAL_COMMUNITY)
Admission: EM | Admit: 2017-07-15 | Discharge: 2017-07-15 | Disposition: A | Discharge status: Home / Self Care | Payer: Medicare Other | Attending: Emergency Medicine | Admitting: Emergency Medicine

## 2017-07-15 ENCOUNTER — Encounter (HOSPITAL_COMMUNITY): Payer: Self-pay | Admitting: Emergency Medicine

## 2017-07-15 DIAGNOSIS — I10 Essential (primary) hypertension: Secondary | ICD-10-CM | POA: Diagnosis not present

## 2017-07-15 DIAGNOSIS — W19XXXA Unspecified fall, initial encounter: Secondary | ICD-10-CM

## 2017-07-15 DIAGNOSIS — S2249XA Multiple fractures of ribs, unspecified side, initial encounter for closed fracture: Secondary | ICD-10-CM

## 2017-07-15 DIAGNOSIS — Y939 Activity, unspecified: Secondary | ICD-10-CM | POA: Diagnosis not present

## 2017-07-15 DIAGNOSIS — S299XXA Unspecified injury of thorax, initial encounter: Secondary | ICD-10-CM | POA: Diagnosis not present

## 2017-07-15 DIAGNOSIS — G309 Alzheimer's disease, unspecified: Secondary | ICD-10-CM | POA: Diagnosis not present

## 2017-07-15 DIAGNOSIS — Y929 Unspecified place or not applicable: Secondary | ICD-10-CM | POA: Insufficient documentation

## 2017-07-15 DIAGNOSIS — Z79899 Other long term (current) drug therapy: Secondary | ICD-10-CM | POA: Insufficient documentation

## 2017-07-15 DIAGNOSIS — R0781 Pleurodynia: Secondary | ICD-10-CM | POA: Diagnosis not present

## 2017-07-15 DIAGNOSIS — R079 Chest pain, unspecified: Secondary | ICD-10-CM | POA: Diagnosis not present

## 2017-07-15 DIAGNOSIS — S2231XA Fracture of one rib, right side, initial encounter for closed fracture: Secondary | ICD-10-CM | POA: Diagnosis not present

## 2017-07-15 DIAGNOSIS — Y999 Unspecified external cause status: Secondary | ICD-10-CM | POA: Insufficient documentation

## 2017-07-15 DIAGNOSIS — M25551 Pain in right hip: Secondary | ICD-10-CM | POA: Diagnosis not present

## 2017-07-15 DIAGNOSIS — W0110XA Fall on same level from slipping, tripping and stumbling with subsequent striking against unspecified object, initial encounter: Secondary | ICD-10-CM | POA: Insufficient documentation

## 2017-07-15 DIAGNOSIS — S79911A Unspecified injury of right hip, initial encounter: Secondary | ICD-10-CM | POA: Diagnosis not present

## 2017-07-15 DIAGNOSIS — F17210 Nicotine dependence, cigarettes, uncomplicated: Secondary | ICD-10-CM | POA: Diagnosis not present

## 2017-07-15 HISTORY — DX: Multiple fractures of ribs, unspecified side, initial encounter for closed fracture: S22.49XA

## 2017-07-15 MED ORDER — HYDROCODONE-ACETAMINOPHEN 5-325 MG PO TABS: 1.0000 | ORAL_TABLET | Freq: Four times a day (QID) | ORAL | 0 refills | Status: DC | PRN

## 2017-07-15 NOTE — ED Notes (Signed)
After assessment of pt, family verbalizes pain to right rib after falling.

## 2017-07-15 NOTE — Discharge Instructions (Signed)
Follow up with your md this week for recheck.  Use tylenol for pain,  if tylenol not helping then use the vicodin

## 2017-07-15 NOTE — ED Triage Notes (Signed)
Patient with grandson. Barbara [name] states that patient fell this morning. Patient vocalizes pain to right hip. Patient ambulatory in triage. Denies hitting head. Family states history of dementia.

## 2017-07-15 NOTE — ED Provider Notes (Signed)
[facility] DEPT Provider Note   CSN: [id number] Arrival date & time: 07/15/17  1058     History   Chief Complaint Chief Complaint  Patient presents with  . Fall    HPI Barbara [name] is a 83 y.o. female.  Patient slipped and fell but her relative tried to catch her and she did not hit her head. patient complains of hip and chest pain   The history is provided by the patient.  Fall  This is a new problem. The current episode started 12 to 24 hours ago. The problem occurs rarely. The problem has been resolved. Associated symptoms include chest pain. Pertinent negatives include no abdominal pain and no headaches. Nothing aggravates the symptoms. Nothing relieves the symptoms. She has tried nothing for the symptoms. The treatment provided no relief.    Past Medical History:  Diagnosis Date  . Alzheimer disease   . Arthritis   . Esophageal stricture   . Essential hypertension   . GERD (gastroesophageal reflux disease)   . Hyperlipidemia     Patient Active Problem List   Diagnosis Date Noted  . Chest pain 01/14/2016  . Syncope 01/14/2016  . AKI (acute kidney injury) ([place]) 01/14/2016  . Dehydration 01/14/2016  . Anemia 01/14/2016    Past Surgical History:  Procedure Laterality Date  . ABDOMINAL SURGERY    . CATARACT EXTRACTION W/PHACO Right 05/17/2015   Procedure: CATARACT EXTRACTION PHACO AND INTRAOCULAR LENS PLACEMENT (IOC);  Surgeon: Rutherford Guys, MD;  Location: [facility];  Service: Ophthalmology;  Laterality: Right;  CDE:8.33  . CATARACT EXTRACTION W/PHACO Left 05/31/2015   Procedure: CATARACT EXTRACTION PHACO AND INTRAOCULAR LENS PLACEMENT (IOC);  Surgeon: Rutherford Guys, MD;  Location: [facility];  Service: Ophthalmology;  Laterality: Left;  CDE:8.80  . COLONOSCOPY N/A 10/28/2015   Procedure: COLONOSCOPY;  Surgeon: Rogene Houston, MD;  Location: [facility];  Service: Endoscopy;  Laterality: N/A;  730  . DILATION AND CURETTAGE OF UTERUS    . ESOPHAGOGASTRODUODENOSCOPY  N/A 10/28/2015   Procedure: ESOPHAGOGASTRODUODENOSCOPY (EGD);  Surgeon: Rogene Houston, MD;  Location: [facility];  Service: Endoscopy;  Laterality: N/A;  . ORTHOPEDIC SURGERY Right    Fractured ankle    OB History    No data available       Home Medications    Prior to Admission medications   Medication Sig Start Date End Date Taking? Authorizing Provider  diclofenac sodium (VOLTAREN) 1 % GEL Apply 2 g topically 4 (four) times daily.   Yes [provider]  diltiazem (CARDIZEM CD) 180 MG 24 hr capsule Take 1 capsule (180 mg total) by mouth daily. 01/17/16  Yes Asencion Noble, MD  docusate sodium (COLACE) 100 MG capsule Take 100 mg by mouth 2 (two) times daily.   Yes [provider]  donepezil (ARICEPT) 10 MG tablet Take 10 mg by mouth at bedtime.   Yes [provider]  losartan-hydrochlorothiazide (HYZAAR) 100-25 MG per tablet Take 1 tablet by mouth daily.   Yes [provider]  pantoprazole (PROTONIX) 40 MG tablet TAKE 1 TABLET BY MOUTH DAILY BEFORE BREAKFAST. 04/29/17  Yes [name], Mechele Dawley, MD  potassium chloride SA (K-DUR,KLOR-CON) 20 MEQ tablet Take 20 mEq by mouth daily.   Yes [provider]  HYDROcodone-acetaminophen (NORCO/VICODIN) 5-325 MG tablet Take 1 tablet by mouth every 6 (six) hours as needed for moderate pain. 07/15/17   Milton Ferguson, MD  Pediatric Multivitamins-Iron (FLINTSTONES PLUS IRON) chewable tablet Chew 1 tablet by mouth 2 (  two) times daily. Patient not taking: Reported on 07/15/2017 10/28/15   Rogene Houston, MD    Family History Family History  Problem Relation Age of Onset  . Heart attack Mother   . Hypertension Mother   . Cancer Brother     Social History Social History  Substance Use Topics  . Smoking status: Current Every Day Smoker    Packs/day: 1.00    Years: 40.00    Types: Cigarettes  . Smokeless tobacco: Never Used  . Alcohol use No     Allergies   Patient has no known allergies.   Review  of Systems Review of Systems  Constitutional: Negative for appetite change and fatigue.  HENT: Negative for congestion, ear discharge and sinus pressure.   Eyes: Negative for discharge.  Respiratory: Negative for cough.   Cardiovascular: Positive for chest pain.  Gastrointestinal: Negative for abdominal pain and diarrhea.  Genitourinary: Negative for frequency and hematuria.  Musculoskeletal: Negative for back pain.       Right hip pain  Skin: Negative for rash.  Neurological: Negative for seizures and headaches.  Psychiatric/Behavioral: Negative for hallucinations.     Physical Exam Updated Vital Signs BP (!) 122/55 (BP Location: Left Arm)   Pulse (!) 56   Temp (!) 97.4 F (36.3 C) (Oral)   Resp 16   Ht 5\' 3"  (1.6 m)   Wt 46.7 kg (103 lb)   SpO2 97%   BMI 18.25 kg/m   Physical Exam  Constitutional: She appears well-developed.  HENT:  Head: Normocephalic.  Eyes: Conjunctivae and EOM are normal. No scleral icterus.  Neck: Neck supple. No thyromegaly present.  Cardiovascular: Normal rate and regular rhythm.  Exam reveals no gallop and no friction rub.   No murmur heard. Pulmonary/Chest: No stridor. She has no wheezes. She has no rales. She exhibits no tenderness.  Tenderness to right chest  Abdominal: She exhibits no distension. There is no tenderness. There is no rebound.  Musculoskeletal: Normal range of motion. She exhibits no edema.  Tenderness to right hip  Lymphadenopathy:    She has no cervical adenopathy.  Neurological: She is alert. She exhibits normal muscle tone. Coordination normal.  Oriented to person only  Skin: No rash noted. No erythema.  Psychiatric: She has a normal mood and affect. Her behavior is normal.     ED Treatments / Results  Labs (all labs ordered are listed, but only abnormal results are displayed) Labs Reviewed - No data to display  EKG  EKG Interpretation None       Radiology Dg Ribs Unilateral W/chest Right  Result Date:  07/15/2017 CLINICAL DATA:  Right rib pain after fall. EXAM: RIGHT RIBS AND CHEST - 3+ VIEW COMPARISON:  Radiographs of January 14, 2016. FINDINGS: Stable cardiomegaly. Atherosclerosis of thoracic aorta is noted. Mildly displaced fractures are seen involving the right fifth, sixth, seventh and eighth ribs. No pneumothorax or pleural effusion is noted. No acute pulmonary disease is noted. Narrowing of the right subacromial space is noted suggesting rotator cuff injury. IMPRESSION: Aortic atherosclerosis. Mildly displaced fractures involving the right fifth, sixth, seventh and eighth ribs. Electronically Signed   By: Marijo Conception, M.D.   On: 07/15/2017 12:13   Dg Hip Unilat  With Pelvis 2-3 Views Right  Result Date: 07/15/2017 CLINICAL DATA:  Fall.  Pain . EXAM: DG HIP (WITH OR WITHOUT PELVIS) 2-3V RIGHT COMPARISON:  MRI 08/02/2016. FINDINGS: Degenerative changes lumbar spine and both hips. Diffuse osteopenia. No acute bony or  joint abnormality identified. No evidence of fracture. Peripheral vascular calcification. Calcified pelvic densities consistent with fibroids. IMPRESSION: 1. Diffuse osteopenia and degenerative change. No acute abnormality identified. No evidence of fracture dislocation. 2. Peripheral vascular disease. 3.  Calcified fibroids . Electronically Signed   By: Marcello Moores  Register   On: 07/15/2017 12:14    Procedures Procedures (including critical care time)  Medications Ordered in ED Medications - No data to display   Initial Impression / Assessment and Plan / ED Course  I have reviewed the triage vital signs and the nursing notes.  Pertinent labs & imaging results that were available during my care of the patient were reviewed by me and considered in my medical decision making (see chart for details).     patient with closed right rib fractures.  Patient will be placed on Vicodin and follow-up with her PCP Final Clinical Impressions(s) / ED Diagnoses   Final diagnoses:  Fall,  initial encounter    New Prescriptions New Prescriptions   HYDROCODONE-ACETAMINOPHEN (NORCO/VICODIN) 5-325 MG TABLET    Take 1 tablet by mouth every 6 (six) hours as needed for moderate pain.     Milton Ferguson, MD 07/15/17 1235

## 2017-09-27 ENCOUNTER — Ambulatory Visit (HOSPITAL_COMMUNITY)
Admission: RE | Admit: 2017-09-27 | Discharge: 2017-09-27 | Disposition: A | Discharge status: Home / Self Care | Payer: Medicare Other | Source: Ambulatory Visit | Attending: Internal Medicine | Admitting: Internal Medicine

## 2017-09-27 DIAGNOSIS — Q6102 Congenital multiple renal cysts: Secondary | ICD-10-CM | POA: Insufficient documentation

## 2017-09-27 DIAGNOSIS — N183 Chronic kidney disease, stage 3 (moderate): Secondary | ICD-10-CM | POA: Diagnosis not present

## 2017-09-27 DIAGNOSIS — N200 Calculus of kidney: Secondary | ICD-10-CM | POA: Diagnosis not present

## 2017-09-27 DIAGNOSIS — D259 Leiomyoma of uterus, unspecified: Secondary | ICD-10-CM | POA: Diagnosis not present

## 2017-09-27 DIAGNOSIS — K5790 Diverticulosis of intestine, part unspecified, without perforation or abscess without bleeding: Secondary | ICD-10-CM | POA: Diagnosis not present

## 2017-09-27 DIAGNOSIS — I700 Atherosclerosis of aorta: Secondary | ICD-10-CM | POA: Diagnosis not present

## 2017-09-27 DIAGNOSIS — K8020 Calculus of gallbladder without cholecystitis without obstruction: Secondary | ICD-10-CM | POA: Insufficient documentation

## 2017-09-27 DIAGNOSIS — N289 Disorder of kidney and ureter, unspecified: Secondary | ICD-10-CM

## 2017-11-02 ENCOUNTER — Other Ambulatory Visit (INDEPENDENT_AMBULATORY_CARE_PROVIDER_SITE_OTHER): Payer: Self-pay | Admitting: Internal Medicine

## 2018-01-28 DIAGNOSIS — N202 Calculus of kidney with calculus of ureter: Secondary | ICD-10-CM | POA: Diagnosis not present

## 2018-01-28 DIAGNOSIS — N200 Calculus of kidney: Secondary | ICD-10-CM | POA: Diagnosis not present

## 2018-01-28 DIAGNOSIS — D49511 Neoplasm of unspecified behavior of right kidney: Secondary | ICD-10-CM | POA: Diagnosis not present

## 2018-02-11 ENCOUNTER — Other Ambulatory Visit: Payer: Self-pay | Admitting: Urology

## 2018-02-12 ENCOUNTER — Encounter (HOSPITAL_COMMUNITY): Payer: Self-pay

## 2018-02-12 ENCOUNTER — Encounter (HOSPITAL_COMMUNITY)
Admission: RE | Admit: 2018-02-12 | Discharge: 2018-02-12 | Disposition: A | Discharge status: Home / Self Care | Payer: Medicare Other | Source: Ambulatory Visit | Attending: Urology | Admitting: Urology

## 2018-02-12 ENCOUNTER — Other Ambulatory Visit: Payer: Self-pay

## 2018-02-12 DIAGNOSIS — N201 Calculus of ureter: Secondary | ICD-10-CM | POA: Diagnosis not present

## 2018-02-12 DIAGNOSIS — Z01810 Encounter for preprocedural cardiovascular examination: Secondary | ICD-10-CM | POA: Diagnosis not present

## 2018-02-12 DIAGNOSIS — Z01812 Encounter for preprocedural laboratory examination: Secondary | ICD-10-CM | POA: Diagnosis not present

## 2018-02-12 DIAGNOSIS — I444 Left anterior fascicular block: Secondary | ICD-10-CM | POA: Diagnosis not present

## 2018-02-12 DIAGNOSIS — R001 Bradycardia, unspecified: Secondary | ICD-10-CM | POA: Diagnosis not present

## 2018-02-12 HISTORY — DX: Alzheimer's disease, unspecified: G30.9

## 2018-02-12 HISTORY — DX: Fracture of one rib, unspecified side, initial encounter for closed fracture: S22.39XA

## 2018-02-12 HISTORY — DX: Duodenal ulcer, unspecified as acute or chronic, without hemorrhage or perforation: K26.9

## 2018-02-12 HISTORY — DX: Iron deficiency anemia, unspecified: D50.9

## 2018-02-12 HISTORY — DX: Dementia in other diseases classified elsewhere, unspecified severity, without behavioral disturbance, psychotic disturbance, mood disturbance, and anxiety: F02.80

## 2018-02-12 HISTORY — DX: Calculus of ureter: N20.1

## 2018-02-12 HISTORY — DX: Chronic kidney disease, unspecified: N18.9

## 2018-02-12 LAB — CBC
HEMATOCRIT: 35.8 % — AB (ref 36.0–46.0)
HEMOGLOBIN: 11.3 g/dL — AB (ref 12.0–15.0)
MCH: 27.8 pg (ref 26.0–34.0)
MCHC: 31.6 g/dL (ref 30.0–36.0)
MCV: 88.2 fL (ref 78.0–100.0)
Platelets: 305 10*3/uL (ref 150–400)
RBC: 4.06 MIL/uL (ref 3.87–5.11)
RDW: 14 % (ref 11.5–15.5)
WBC: 5.4 10*3/uL (ref 4.0–10.5)

## 2018-02-12 LAB — BASIC METABOLIC PANEL
ANION GAP: 9 (ref 5–15)
BUN: 33 mg/dL — ABNORMAL HIGH (ref 6–20)
CALCIUM: 9.9 mg/dL (ref 8.9–10.3)
CO2: 24 mmol/L (ref 22–32)
Chloride: 108 mmol/L (ref 101–111)
Creatinine, Ser: 1.54 mg/dL — ABNORMAL HIGH (ref 0.44–1.00)
GFR calc Af Amer: 34 mL/min — ABNORMAL LOW (ref >60)
GFR, EST NON AFRICAN AMERICAN: 29 mL/min — AB (ref >60)
GLUCOSE: 98 mg/dL (ref 65–99)
POTASSIUM: 4.2 mmol/L (ref 3.5–5.1)
SODIUM: 141 mmol/L (ref 135–145)

## 2018-02-12 NOTE — Progress Notes (Signed)
ECHO 2017 in epic

## 2018-02-12 NOTE — Patient Instructions (Addendum)
Barbara [name]  02/12/2018   Your procedure is scheduled on: 02/18/2018    Report to [facility] Main  Entrance    Report to admitting at   0800 AM   Call this number if you have problems the morning of surgery [phone]   Remember: Do not eat food or drink liquids :After Midnight.     Take these medicines the morning of surgery with A SIP OF WATER:  Diltiazem, pantoprazole, hydrocodone if needed                                You may not have any metal on your body including hair pins and              piercings  Do not wear jewelry, make-up, lotions, powders or perfumes, deodorant             Do not wear nail polish.  Do not shave  48 hours prior to surgery.              Do not bring valuables to the hospital. [place].  Contacts, dentures or bridgework may not be worn into surgery. .     Patients discharged the day of surgery will not be allowed to drive home.  Name and phone number of your driver:                Please read over the following fact sheets you were given: _____________________________________________________________________             [facility] - Preparing for Surgery Before surgery, you can play an important role.  Because skin is not sterile, your skin needs to be as free of germs as possible.  You can reduce the number of germs on your skin by washing with CHG (chlorahexidine gluconate) soap before surgery.  CHG is an antiseptic cleaner which kills germs and bonds with the skin to continue killing germs even after washing. Please DO NOT use if you have an allergy to CHG or antibacterial soaps.  If your skin becomes reddened/irritated stop using the CHG and inform your nurse when you arrive at Short Stay. Do not shave (including legs and underarms) for at least 48 hours prior to the first CHG shower.  You may shave your face/neck. Please follow these instructions carefully:  1.   Shower with CHG Soap the night before surgery and the  morning of Surgery.  2.  If you choose to wash your hair, wash your hair first as usual with your  normal  shampoo.  3.  After you shampoo, rinse your hair and body thoroughly to remove the  shampoo.                           4.  Use CHG as you would any other liquid soap.  You can apply chg directly  to the skin and wash                       Gently with a scrungie or clean washcloth.  5.  Apply the CHG Soap to your body ONLY FROM THE NECK DOWN.   Do not use on  face/ open                           Wound or open sores. Avoid contact with eyes, ears mouth and genitals (private parts).                       Wash face,  Genitals (private parts) with your normal soap.             6.  Wash thoroughly, paying special attention to the area where your surgery  will be performed.  7.  Thoroughly rinse your body with warm water from the neck down.  8.  DO NOT shower/wash with your normal soap after using and rinsing off  the CHG Soap.                9.  Pat yourself dry with a clean towel.            10.  Wear clean pajamas.            11.  Place clean sheets on your bed the night of your first shower and do not  sleep with pets. Day of Surgery : Do not apply any lotions/deodorants the morning of surgery.  Please wear clean clothes to the hospital/surgery center.  FAILURE TO FOLLOW THESE INSTRUCTIONS MAY RESULT IN THE CANCELLATION OF YOUR SURGERY PATIENT SIGNATURE_________________________________  NURSE SIGNATURE__________________________________  ________________________________________________________________________

## 2018-02-12 NOTE — Progress Notes (Signed)
Bmp routed via epic to Dr Irine Seal

## 2018-02-17 NOTE — H&P (Signed)
CC: I have kidney stones.  HPI: Barbara [name] is a 84 year-old female patient who was referred by Dr. Paula Compton. Barbara Pollock, MD who is here for renal calculi.    Barbara [name] is an 84 yo BF who is sent in consultation by [name] for left ureteral and right renal stones seen on CT in 11/18 that was done for f/u of a renal lesion. She had a CT in 2016 and had no stones. There is a large 1.5 x 0.9 cm stone at the left UVJ with smaller proximal stones and a large RLP stone. She has chronic lower abdominal pain but no flank pian. She has had no symptomatic UTI's. She has severe dementia and voids incontinently. she has had no hematuria. She has had no recent fevers. The right renal lesion that was the indication for the study is 2.2cm and stable since an MRI in 9/17. It was suspicious for a renal cell carcinoma. She also has bilateral renal cysts. Her UA today looks infected. She has had no GU surgery or prior stones. She has not had recurrent UTI's.      ALLERGIES: None   MEDICATIONS: Diltiazem 24Hr Cd 180 mg capsule, ext release 24 hr  Donepezil Hcl 10 mg tablet  Losartan-Hydrochlorothiazide  Pantoprazole Sodium 40 mg tablet, delayed release  Potassium Chloride 20 meq packet     GU PSH: None   NON-GU PSH: Cataract surgery Foot surgery (unspecified)    GU PMH: None   NON-GU PMH: Arthritis GERD Hypercholesterolemia Hypertension Unspecified dementia without behavioral disturbance    FAMILY HISTORY: None   SOCIAL HISTORY: Marital Status: Unknown Preferred Language: English; Race: Black or African American    REVIEW OF SYSTEMS:    GU Review Female:   Patient reports frequent urination and get up at night to urinate. Patient denies hard to postpone urination, burning /pain with urination, leakage of urine, stream starts and stops, trouble starting your stream, have to strain to urinate, and being pregnant.  Gastrointestinal (Upper):   Patient denies nausea, indigestion/ heartburn, and  vomiting.  Gastrointestinal (Lower):   Patient denies diarrhea and constipation.  Constitutional:   Patient denies fever, night sweats, weight loss, and fatigue.  Skin:   Patient denies skin rash/ lesion and itching.  Eyes:   Patient denies blurred vision and double vision.  Ears/ Nose/ Throat:   Patient denies sore throat and sinus problems.  Hematologic/Lymphatic:   Patient reports swollen glands and easy bruising.   Cardiovascular:   Patient denies leg swelling and chest pains.  Respiratory:   Patient denies cough and shortness of breath.  Endocrine:   Patient denies excessive thirst.  Musculoskeletal:   Patient denies back pain and joint pain.  Neurological:   Patient denies headaches and dizziness.  Psychologic:   Patient denies depression and anxiety.   VITAL SIGNS:      01/28/2018 09:55 AM  BP 127/68 mmHg  Pulse 52 /min  Temperature 98.2 F / 36.7 C   MULTI-SYSTEM PHYSICAL EXAMINATION:    Constitutional: Well-nourished. No physical deformities. Normally developed. Good grooming.  Neck: Neck symmetrical, not swollen. Normal tracheal position.  Respiratory: No labored breathing, no use of accessory muscles. Normal breath sounds.  Cardiovascular: Regular rate and rhythm. No murmur, no gallop. Normal temperature, , no swelling, no varicosities.   Lymphatic: No enlargement, no tenderness of axillae,supraclavicular or, neck lymph nodes.  Skin: No paleness, no jaundice, no cyanosis. No lesion, no ulcer, no rash.  Neurologic / Psychiatric: severe dementia  and minimally communicative but pleasant.   Gastrointestinal: No mass, no tenderness, no rigidity, non obese abdomen.  Musculoskeletal: She is in a wheelchair but has normal extremities.      PAST DATA REVIEWED:  Source Of History:  Patient  Records Review:   Previous Doctor Records  Urine Test Review:   Urinalysis  X-Ray Review: C.T. Stone Protocol: Reviewed Films. Discussed With Patient. The left UVJ stones have not moved and  appear to have enlarged. The right renal stones are stable. There is some air in the bladder but not in the bladder wall.  C.T. Abdomen/Pelvis: Reviewed Films. Reviewed Report. Discussed With Patient. I have reviewed her films from 11/18 and compared them with today's study.    Notes:                     I have reviewed records from [name] from 11/18.    PROCEDURES:         C.T. Urogram - [id number]  Persistent non-obstructing LUVJ stones with some enlargement. stable right renal stones. Air in bladder. bilateral renal cysts and right renal mass. Full report pending.                Urinalysis w/Scope Dipstick Dipstick Cont'd Micro  Color: Yellow Bilirubin: Neg WBC/hpf: 20 - 40/hpf  Appearance: Turbid Ketones: Neg RBC/hpf: 3 - 10/hpf  Specific Gravity: 1.025 Blood: 3+ Bacteria: Mod (26-50/hpf)  pH: <=5.0 Protein: Trace Cystals: NS (Not Seen)  Glucose: Neg Urobilinogen: 0.2 Casts: NS (Not Seen)    Nitrites: Positive Trichomonas: Not Present    Leukocyte Esterase: 3+ Mucous: Not Present      Epithelial Cells: 0 - 5/hpf      Yeast: NS (Not Seen)      Sperm: Not Present    Notes: MICROSCOPIC PERFORMED ON UNCONCENTRATED URINE    ASSESSMENT:      ICD-10 Details  1 GU:   Renal and ureteral calculus - N20.2 She has a large cluster of stones in the left distal ureter and may have ureterocele based on the near intravesical appearance of the stones. There is no obstruction but she has a probably UTI with some gas in the bladder but no acute symptoms. She has stable non-obstructing right renal stones as well. I am going to culture the urine and will initiate treatment prior to cystoscopy with ureteroscopy and possible stent for the left distal stones. I don't think she needs the right renal stones treated at this time. I have reviewed the risks of ureteroscopy including bleeding, infection, ureteral injury, need for a stent or secondary procedures, thrombotic events and anesthetic complications.    2   Urinary Tract Inf, Unspec site - N39.0 Urine culture sent.   3   Right renal neoplasm - D49.511 Right, She has a stable right renal mass on observation for the last several years.    PLAN:           Orders Labs Urine Culture  X-Rays: C.T. Stone Protocol Without Contrast - She had new large right renal and large left distal ureteral stones on a CT on 09/27/17 and I need the CT to determine their status. She has too many pelvic calcifications for a KUB to be useful for assessing the stones.           Schedule Return Visit/Planned Activity: Next Available Appointment - Schedule Surgery          Document Letter(s):  Created for Patient: Clinical Summary

## 2018-02-18 ENCOUNTER — Ambulatory Visit (HOSPITAL_COMMUNITY): Payer: Medicare Other | Admitting: Certified Registered Nurse Anesthetist

## 2018-02-18 ENCOUNTER — Other Ambulatory Visit: Payer: Self-pay

## 2018-02-18 ENCOUNTER — Ambulatory Visit (HOSPITAL_COMMUNITY)
Admission: RE | Admit: 2018-02-18 | Discharge: 2018-02-18 | Disposition: A | Discharge status: Home / Self Care | Payer: Medicare Other | Source: Ambulatory Visit | Attending: Urology | Admitting: Urology

## 2018-02-18 ENCOUNTER — Encounter (HOSPITAL_COMMUNITY): Payer: Self-pay | Admitting: Anesthesiology

## 2018-02-18 ENCOUNTER — Ambulatory Visit (HOSPITAL_COMMUNITY): Payer: Medicare Other

## 2018-02-18 ENCOUNTER — Encounter (HOSPITAL_COMMUNITY)
Admission: RE | Disposition: A | Discharge status: Home / Self Care | Payer: Self-pay | Source: Ambulatory Visit | Attending: Urology

## 2018-02-18 DIAGNOSIS — N202 Calculus of kidney with calculus of ureter: Secondary | ICD-10-CM | POA: Insufficient documentation

## 2018-02-18 DIAGNOSIS — Q6231 Congenital ureterocele, orthotopic: Secondary | ICD-10-CM | POA: Diagnosis not present

## 2018-02-18 DIAGNOSIS — N2889 Other specified disorders of kidney and ureter: Secondary | ICD-10-CM | POA: Diagnosis not present

## 2018-02-18 DIAGNOSIS — I10 Essential (primary) hypertension: Secondary | ICD-10-CM | POA: Diagnosis not present

## 2018-02-18 DIAGNOSIS — Q625 Duplication of ureter: Secondary | ICD-10-CM | POA: Insufficient documentation

## 2018-02-18 DIAGNOSIS — F0390 Unspecified dementia without behavioral disturbance: Secondary | ICD-10-CM | POA: Insufficient documentation

## 2018-02-18 DIAGNOSIS — R55 Syncope and collapse: Secondary | ICD-10-CM | POA: Diagnosis not present

## 2018-02-18 DIAGNOSIS — E7800 Pure hypercholesterolemia, unspecified: Secondary | ICD-10-CM | POA: Insufficient documentation

## 2018-02-18 DIAGNOSIS — N200 Calculus of kidney: Secondary | ICD-10-CM | POA: Diagnosis present

## 2018-02-18 DIAGNOSIS — Z79899 Other long term (current) drug therapy: Secondary | ICD-10-CM | POA: Insufficient documentation

## 2018-02-18 DIAGNOSIS — N390 Urinary tract infection, site not specified: Secondary | ICD-10-CM | POA: Diagnosis not present

## 2018-02-18 DIAGNOSIS — N201 Calculus of ureter: Secondary | ICD-10-CM | POA: Diagnosis not present

## 2018-02-18 DIAGNOSIS — K219 Gastro-esophageal reflux disease without esophagitis: Secondary | ICD-10-CM | POA: Insufficient documentation

## 2018-02-18 DIAGNOSIS — Z87891 Personal history of nicotine dependence: Secondary | ICD-10-CM | POA: Insufficient documentation

## 2018-02-18 DIAGNOSIS — N210 Calculus in bladder: Secondary | ICD-10-CM | POA: Diagnosis not present

## 2018-02-18 HISTORY — PX: CYSTOSCOPY WITH RETROGRADE PYELOGRAM, URETEROSCOPY AND STENT PLACEMENT: SHX5789

## 2018-02-18 HISTORY — PX: HOLMIUM LASER APPLICATION: SHX5852

## 2018-02-18 SURGERY — CYSTOURETEROSCOPY, WITH RETROGRADE PYELOGRAM AND STENT INSERTION
Anesthesia: General | Laterality: Left

## 2018-02-18 MED ORDER — SODIUM CHLORIDE 0.9% FLUSH: 3.0000 mL | Freq: Two times a day (BID) | INTRAVENOUS | Status: DC

## 2018-02-18 MED ORDER — PROMETHAZINE HCL 25 MG/ML IJ SOLN: 6.2500 mg | INTRAMUSCULAR | Status: DC | PRN

## 2018-02-18 MED ORDER — EPHEDRINE SULFATE-NACL 50-0.9 MG/10ML-% IV SOSY
PREFILLED_SYRINGE | INTRAVENOUS | Status: DC | PRN
  Administered 2018-02-18: 5 mg via INTRAVENOUS
  Administered 2018-02-18 (×2): 10 mg via INTRAVENOUS
  Administered 2018-02-18 (×3): 5 mg via INTRAVENOUS

## 2018-02-18 MED ORDER — LACTATED RINGERS IV SOLN
INTRAVENOUS | Status: DC
  Administered 2018-02-18 (×2): via INTRAVENOUS

## 2018-02-18 MED ORDER — DEXAMETHASONE SODIUM PHOSPHATE 10 MG/ML IJ SOLN
INTRAMUSCULAR | Status: AC
  Filled 2018-02-18: qty 1

## 2018-02-18 MED ORDER — HYDROCODONE-ACETAMINOPHEN 5-325 MG PO TABS: 1.0000 | ORAL_TABLET | Freq: Four times a day (QID) | ORAL | 0 refills | Status: AC | PRN

## 2018-02-18 MED ORDER — ONDANSETRON HCL 4 MG/2ML IJ SOLN
INTRAMUSCULAR | Status: AC
  Filled 2018-02-18: qty 2

## 2018-02-18 MED ORDER — LIDOCAINE 2% (20 MG/ML) 5 ML SYRINGE
INTRAMUSCULAR | Status: AC
  Filled 2018-02-18: qty 5

## 2018-02-18 MED ORDER — FENTANYL CITRATE (PF) 250 MCG/5ML IJ SOLN
INTRAMUSCULAR | Status: AC
  Filled 2018-02-18: qty 5

## 2018-02-18 MED ORDER — PROPOFOL 10 MG/ML IV BOLUS
INTRAVENOUS | Status: AC
  Filled 2018-02-18: qty 20

## 2018-02-18 MED ORDER — DEXAMETHASONE SODIUM PHOSPHATE 10 MG/ML IJ SOLN
INTRAMUSCULAR | Status: DC | PRN
  Administered 2018-02-18: 10 mg via INTRAVENOUS

## 2018-02-18 MED ORDER — PROPOFOL 10 MG/ML IV BOLUS
INTRAVENOUS | Status: DC | PRN
  Administered 2018-02-18: 100 mg via INTRAVENOUS

## 2018-02-18 MED ORDER — ACETAMINOPHEN 650 MG RE SUPP
650.0000 mg | RECTAL | Status: DC | PRN
  Filled 2018-02-18: qty 1

## 2018-02-18 MED ORDER — FENTANYL CITRATE (PF) 250 MCG/5ML IJ SOLN
INTRAMUSCULAR | Status: DC | PRN
  Administered 2018-02-18 (×4): 25 ug via INTRAVENOUS

## 2018-02-18 MED ORDER — ACETAMINOPHEN 325 MG PO TABS: 650.0000 mg | ORAL_TABLET | ORAL | Status: DC | PRN

## 2018-02-18 MED ORDER — LACTATED RINGERS IV SOLN: INTRAVENOUS | Status: DC

## 2018-02-18 MED ORDER — OXYCODONE HCL 5 MG PO TABS: 5.0000 mg | ORAL_TABLET | ORAL | Status: DC | PRN

## 2018-02-18 MED ORDER — ONDANSETRON HCL 4 MG/2ML IJ SOLN
INTRAMUSCULAR | Status: DC | PRN
  Administered 2018-02-18: 4 mg via INTRAVENOUS

## 2018-02-18 MED ORDER — SODIUM CHLORIDE 0.9 % IV SOLN: 250.0000 mL | INTRAVENOUS | Status: DC | PRN

## 2018-02-18 MED ORDER — LIDOCAINE HCL (CARDIAC) 10 MG/ML IV SOLN
INTRAVENOUS | Status: DC | PRN
  Administered 2018-02-18: 40 mg via INTRAVENOUS

## 2018-02-18 MED ORDER — CEFAZOLIN SODIUM-DEXTROSE 2-4 GM/100ML-% IV SOLN
2.0000 g | INTRAVENOUS | Status: AC
  Administered 2018-02-18: 2 g via INTRAVENOUS
  Filled 2018-02-18: qty 100

## 2018-02-18 MED ORDER — SODIUM CHLORIDE 0.9 % IR SOLN
Status: DC | PRN
  Administered 2018-02-18: 6000 mL

## 2018-02-18 MED ORDER — MIDAZOLAM HCL 2 MG/2ML IJ SOLN
INTRAMUSCULAR | Status: AC
  Filled 2018-02-18: qty 2

## 2018-02-18 MED ORDER — FENTANYL CITRATE (PF) 100 MCG/2ML IJ SOLN: 25.0000 ug | INTRAMUSCULAR | Status: DC | PRN

## 2018-02-18 MED ORDER — SODIUM CHLORIDE 0.9% FLUSH: 3.0000 mL | INTRAVENOUS | Status: DC | PRN

## 2018-02-18 MED ORDER — MORPHINE SULFATE (PF) 4 MG/ML IV SOLN: 1.0000 mg | INTRAVENOUS | Status: DC | PRN

## 2018-02-18 MED ORDER — MEPERIDINE HCL 50 MG/ML IJ SOLN: 6.2500 mg | INTRAMUSCULAR | Status: DC | PRN

## 2018-02-18 SURGICAL SUPPLY — 26 items
BAG URO CATCHER STRL LF (MISCELLANEOUS) ×3 IMPLANT
BASKET STONE NCOMPASS (UROLOGICAL SUPPLIES) IMPLANT
BASKET ZERO TIP NITINOL 2.4FR (BASKET) ×2 IMPLANT
BSKT STON RTRVL ZERO TP 2.4FR (BASKET) ×1
CATH URET 5FR 28IN OPEN ENDED (CATHETERS) ×2 IMPLANT
CATH URET DUAL LUMEN 6-10FR 50 (CATHETERS) ×1 IMPLANT
CLOTH BEACON ORANGE TIMEOUT ST (SAFETY) ×3 IMPLANT
COVER FOOTSWITCH UNIV (MISCELLANEOUS) ×2 IMPLANT
COVER SURGICAL LIGHT HANDLE (MISCELLANEOUS) ×3 IMPLANT
EXTRACTOR STONE NITINOL NGAGE (UROLOGICAL SUPPLIES) ×1 IMPLANT
FIBER LASER FLEXIVA 1000 (UROLOGICAL SUPPLIES) ×2 IMPLANT
FIBER LASER FLEXIVA 365 (UROLOGICAL SUPPLIES) IMPLANT
FIBER LASER FLEXIVA 550 (UROLOGICAL SUPPLIES) IMPLANT
FIBER LASER TRAC TIP (UROLOGICAL SUPPLIES) IMPLANT
GLOVE SURG SS PI 8.0 STRL IVOR (GLOVE) ×2 IMPLANT
GOWN STRL REUS W/TWL XL LVL3 (GOWN DISPOSABLE) ×3 IMPLANT
GUIDEWIRE STR DUAL SENSOR (WIRE) ×3 IMPLANT
IV NS 1000ML (IV SOLUTION)
IV NS 1000ML BAXH (IV SOLUTION) ×1 IMPLANT
IV NS IRRIG 3000ML ARTHROMATIC (IV SOLUTION) ×1 IMPLANT
MANIFOLD NEPTUNE II (INSTRUMENTS) ×3 IMPLANT
PACK CYSTO (CUSTOM PROCEDURE TRAY) ×3 IMPLANT
SHEATH URETERAL 12FRX35CM (MISCELLANEOUS) ×1 IMPLANT
TUBING CONNECTING 10 (TUBING) ×2 IMPLANT
TUBING CONNECTING 10' (TUBING) ×1
TUBING UROLOGY SET (TUBING) ×3 IMPLANT

## 2018-02-18 NOTE — Op Note (Signed)
Procedure: 1.  Cystoscopy with unroofing of left ureterocele. 2.  Left ureteroscopic stone extraction. 3.  Cystolitholapaxy of 2 cm stone.  Preop diagnosis: Multiple left distal ureteral stones with probable left ureterocele.  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  The lead stone is 16 mm.  Surgeon: Dr. Irine Seal.  Anesthesia: General.  Drain: None.  Specimen: Multiple stones.  EBL: Minimal.  Complications: None.  Indications: Barbara [name] is an 84 year old black female who was found to have multiple left distal ureteral stones on a CT scan.  It was felt that she most likely had a left ureterocele.  She was also noted to have left lower pole renal stones.  She was not obstructed but it was felt due to the number and size of the stones on the left the removal would be most appropriate to avoid future issues.  Procedure: She was given Ancef.  She was taken to the operating room where general anesthetic was induced.  She was placed in the lithotomy position and PAS hose were placed.  She was prepped with Betadine Betadine solution and draped in the usual sterile fashion.  Cystoscopy was performed using the 23 [place] scope and 30 degree lens.  Examination revealed a normal urethra.  Bladder wall had mild trabeculation.  No tumors or stones were noted.  The right ureteral orifice was unremarkable.  The left ureteral orifice was mounded up consistent with a ureterocele with a stone visible at the meatus.  The cystoscope was replaced with the continuous-flow resectoscope sheath which was fitted with an Beatrix Fetters handle with a Collins knife and the 30 degree lens.  The ureterocele was widely unroofed for approximately 2 cm in the distal stone was teased into the bladder with the tip of the [employer].  An initial attempt to basket the stone with a 2 cm nitinol basket was unsuccessful because of the stone size.  I then passed  the cystoscope into the distal ureter because the dilation had left a diameter sufficient for the 23 [place] scope.  Several more large stones were identified as had been seen on the CT scan and these were removed with the nitinol basket.  Approximately 3 cm proximal to the meatus she was noted to have a duplication of the ureter and there were stones present in both moities.  I was able to remove all of the stones with the basket.  At this point the 6-1/2 French semirigid ureteroscope was passed and using a sensor guidewire as a filiform I was able to advance the scope into the upper proximal ureter in both of the ureteral limbs.  No additional stones were seen.  The ureteroscope was removed and it was felt a stent was not indicated.  The cystoscope was reinserted and the stone that was now in the bladder was engaged with 1000 m laser fiber set on 1 W and 10 Hz.  The stone was broken into manageable fragments which were then removed through aspiration and the use of the basket.  When all stone fragments had been eliminated, the bladder was drained.  The cystoscope was removed.  She was taken down from lithotomy position, her anesthetic was reversed and she was moved to recovery room in stable condition.  There were no complications.

## 2018-02-18 NOTE — Transfer of Care (Signed)
Immediate Anesthesia Transfer of Care Note  Patient: Barbara [name]  Procedure(s) Performed: CYSTOSCOPY WITH LEFT   UNROOF URETEROCELE LEFT URETEROSCOPY, LITHALOPAXY  WITH  LASER (Left ) HOLMIUM LASER APPLICATION (Left )  Patient Location: PACU  Anesthesia Type:General  Level of Consciousness: sedated  Airway & Oxygen Therapy: Patient Spontanous Breathing and Patient connected to face mask oxygen  Post-op Assessment: Report given to RN and Post -op Vital signs reviewed and stable  Post vital signs: Reviewed and stable  Last Vitals:  Vitals Value Taken Time  BP 148/65 02/18/2018 10:42 AM  Temp    Pulse 60 02/18/2018 10:43 AM  Resp 13 02/18/2018 10:43 AM  SpO2 100 % 02/18/2018 10:43 AM  Vitals shown include unvalidated device data.  Last Pain:  Vitals:   02/18/18 0840  TempSrc: Oral         Complications: No apparent anesthesia complications

## 2018-02-18 NOTE — Anesthesia Procedure Notes (Signed)
Date/Time: 02/18/2018 10:35 AM Performed by: Cynda Familia, CRNA Oxygen Delivery Method: Simple face mask Placement Confirmation: positive ETCO2 and breath sounds checked- equal and bilateral Dental Injury: Teeth and Oropharynx as per pre-operative assessment

## 2018-02-18 NOTE — Anesthesia Postprocedure Evaluation (Signed)
Anesthesia Post Note  Patient: SHAKEDRA BEAM  Procedure(s) Performed: CYSTOSCOPY WITH LEFT   UNROOF URETEROCELE LEFT URETEROSCOPY, LITHALOPAXY  WITH  LASER (Left ) HOLMIUM LASER APPLICATION (Left )     Patient location during evaluation: PACU Anesthesia Type: General Level of consciousness: awake Pain management: pain level controlled Vital Signs Assessment: post-procedure vital signs reviewed and stable Respiratory status: spontaneous breathing, nonlabored ventilation, respiratory function stable and patient connected to nasal cannula oxygen Cardiovascular status: blood pressure returned to baseline and stable Postop Assessment: no apparent nausea or vomiting Anesthetic complications: no    Last Vitals:  Vitals:   02/18/18 1100 02/18/18 1115  BP: (!) 158/60 (!) 159/61  Pulse: 85 63  Resp: 20 12  Temp:  (!) 36.4 C  SpO2: 100% 100%    Last Pain:  Vitals:   02/18/18 1124  TempSrc:   PainSc: 0-No pain                 Effie Berkshire

## 2018-02-18 NOTE — Anesthesia Procedure Notes (Signed)
Procedure Name: LMA Insertion Date/Time: 02/18/2018 9:37 AM Performed by: Lissa Morales, CRNA Pre-anesthesia Checklist: Patient identified, Emergency Drugs available, Suction available, Patient being monitored and Timeout performed Patient Re-evaluated:Patient Re-evaluated prior to induction Oxygen Delivery Method: Circle system utilized Preoxygenation: Pre-oxygenation with 100% oxygen Induction Type: IV induction Ventilation: Mask ventilation without difficulty LMA: LMA inserted and LMA with gastric port inserted LMA Size: 4.0 Tube type: Oral (20 cc air) Placement Confirmation: positive ETCO2 and breath sounds checked- equal and bilateral Dental Injury: Teeth and Oropharynx as per pre-operative assessment  Comments: Smooth IV induction Hollis--- LMA insertion AM CRNA--- atraumatic--- no teeth-- mouth as preop-- bilat BS Holl[place]

## 2018-02-18 NOTE — Discharge Instructions (Signed)
CYSTOSCOPY HOME CARE INSTRUCTIONS  Activity: Rest for the remainder of the day.  Do not drive or operate equipment today.  You may resume normal activities in one to two days as instructed by your physician.   Meals: Drink plenty of liquids and eat light foods such as gelatin or soup this evening.  You may return to a normal meal plan tomorrow.  Return to Work: You may return to work in one to two days or as instructed by your physician.  Special Instructions / Symptoms: Call your physician if any of these symptoms occur:   -persistent or heavy bleeding  -bleeding which continues after first few urination  -large blood clots that are difficult to pass  -urine stream diminishes or stops completely  -fever equal to or higher than 101 degrees Farenheit.  -cloudy urine with a strong, foul odor  -severe pain  Females should always wipe from front to back after elimination.  You may feel some burning pain when you urinate.  This should disappear with time.  Applying moist heat to the lower abdomen or a hot tub bath may help relieve the pain. \  Bring the stone fragments to the f/u visit.   Patient Signature:  ________________________________________________________  Nurse's Signature:  ________________________________________________________

## 2018-02-18 NOTE — Interval H&P Note (Signed)
History and Physical Interval Note: CT on 3/12 showed a 1.5cm left UVJ stone with multiple stones stacked above it in the distal and mid ureter.    02/18/2018 9:20 AM  Barbara [name]  has presented today for surgery, with the diagnosis of LEFT URETEROVESICAL JUNCTION STONE POSSIBLE URETEROCELE  The various methods of treatment have been discussed with the patient and family. After consideration of risks, benefits and other options for treatment, the patient has consented to  Procedure(s): CYSTOSCOPY WITH LEFT  RETROGRADE POSSIBLE UNROOF URETEROCELE LEFTURETEROSCOPY WITH  LASER AND STENT PLACEMENT (Left) HOLMIUM LASER APPLICATION (Left) as a surgical intervention .  The patient's history has been reviewed, patient examined, no change in status, stable for surgery.  I have reviewed the patient's chart and labs.  Questions were answered to the patient's satisfaction.     Irine Seal

## 2018-02-18 NOTE — Anesthesia Preprocedure Evaluation (Addendum)
Anesthesia Evaluation  Patient identified by MRN, date of birth, ID band Patient confused    Reviewed: Allergy & Precautions, NPO status , Patient's Chart, lab work & pertinent test results  Airway Mallampati: II  TM Distance: >3 FB Neck ROM: Full    Dental  (+) Dental Advisory Given   Pulmonary former smoker,    breath sounds clear to auscultation       Cardiovascular hypertension, Pt. on medications  Rhythm:Regular Rate:Normal     Neuro/Psych PSYCHIATRIC DISORDERS Dementia    GI/Hepatic Neg liver ROS, PUD, GERD  Medicated,  Endo/Other  negative endocrine ROS  Renal/GU CRFRenal disease     Musculoskeletal  (+) Arthritis ,   Abdominal Normal abdominal exam  (+)   Peds  Hematology  (+) anemia ,   Anesthesia Other Findings   Reproductive/Obstetrics                            Anesthesia Physical Anesthesia Plan  ASA: III  Anesthesia Plan: General   Post-op Pain Management:    Induction: Intravenous  PONV Risk Score and Plan: 4 or greater and Ondansetron, Dexamethasone and Treatment may vary due to age or medical condition  Airway Management Planned: LMA  Additional Equipment: None  Intra-op Plan:   Post-operative Plan: Extubation in OR  Informed Consent: I have reviewed the patients History and Physical, chart, labs and discussed the procedure including the risks, benefits and alternatives for the proposed anesthesia with the patient or authorized representative who has indicated his/her understanding and acceptance.   Consent reviewed with POA  Plan Discussed with: CRNA  Anesthesia Plan Comments:        Anesthesia Quick Evaluation

## 2018-02-19 ENCOUNTER — Encounter (HOSPITAL_COMMUNITY): Payer: Self-pay | Admitting: Urology

## 2018-02-27 DIAGNOSIS — N202 Calculus of kidney with calculus of ureter: Secondary | ICD-10-CM | POA: Diagnosis not present

## 2018-04-15 DIAGNOSIS — N202 Calculus of kidney with calculus of ureter: Secondary | ICD-10-CM | POA: Diagnosis not present

## 2018-04-15 DIAGNOSIS — D49511 Neoplasm of unspecified behavior of right kidney: Secondary | ICD-10-CM | POA: Diagnosis not present

## 2018-05-26 DIAGNOSIS — N202 Calculus of kidney with calculus of ureter: Secondary | ICD-10-CM | POA: Diagnosis not present

## 2018-06-02 DIAGNOSIS — N202 Calculus of kidney with calculus of ureter: Secondary | ICD-10-CM | POA: Diagnosis not present

## 2018-06-02 DIAGNOSIS — D49511 Neoplasm of unspecified behavior of right kidney: Secondary | ICD-10-CM | POA: Diagnosis not present

## 2018-06-26 ENCOUNTER — Encounter (HOSPITAL_COMMUNITY): Payer: Self-pay

## 2018-06-26 ENCOUNTER — Emergency Department (HOSPITAL_COMMUNITY)
Admission: EM | Admit: 2018-06-26 | Discharge: 2018-07-20 | Disposition: E | Payer: Medicare Other | Attending: Emergency Medicine | Admitting: Emergency Medicine

## 2018-06-26 DIAGNOSIS — Z87891 Personal history of nicotine dependence: Secondary | ICD-10-CM | POA: Insufficient documentation

## 2018-06-26 DIAGNOSIS — R4020 Unspecified coma: Secondary | ICD-10-CM | POA: Diagnosis not present

## 2018-06-26 DIAGNOSIS — G309 Alzheimer's disease, unspecified: Secondary | ICD-10-CM | POA: Diagnosis not present

## 2018-06-26 DIAGNOSIS — R0681 Apnea, not elsewhere classified: Secondary | ICD-10-CM | POA: Diagnosis not present

## 2018-06-26 DIAGNOSIS — N189 Chronic kidney disease, unspecified: Secondary | ICD-10-CM | POA: Insufficient documentation

## 2018-06-26 DIAGNOSIS — Z79899 Other long term (current) drug therapy: Secondary | ICD-10-CM | POA: Diagnosis not present

## 2018-06-26 DIAGNOSIS — R0602 Shortness of breath: Secondary | ICD-10-CM | POA: Diagnosis not present

## 2018-06-26 DIAGNOSIS — I469 Cardiac arrest, cause unspecified: Secondary | ICD-10-CM | POA: Diagnosis not present

## 2018-06-26 DIAGNOSIS — I499 Cardiac arrhythmia, unspecified: Secondary | ICD-10-CM | POA: Diagnosis not present

## 2018-06-26 DIAGNOSIS — E1165 Type 2 diabetes mellitus with hyperglycemia: Secondary | ICD-10-CM | POA: Diagnosis not present

## 2018-07-20 NOTE — ED Triage Notes (Addendum)
[facility] called out for "sick person" found pt to be unresponsive on ems arrival,  Pt in asystole alternating with PEA.  Pt received 3 epi via IO access.   On arrival to e.d., pt has no pulse or respirations.  King airway in place per ems, and bagging continues on arrival.   Dr Melina Copa in room assessing pt

## 2018-07-20 NOTE — ED Provider Notes (Signed)
[facility] EMERGENCY DEPARTMENT Provider Note   CSN: [id number] Arrival date & time: June 30, 2018  2029     History   Chief Complaint Chief Complaint  Patient presents with  . cpr in progress    HPI Barbara [name] is a 84 y.o. female.  Presents to the emergency department with active CPR in progress.  They were called for sick person and found her to be unresponsive in asystole alternating with PEA.  They were anticipating transferring her to come in if she regained pulses but ultimately diverted here.  She had epi through an IO.  She had a King airway in place until just being transferred in.  Daughter here states that the patient had not been feeling well for the past week but no specific chest pain or shortness of breath.  She became weak and unresponsive and was lowered to the ground there.  She received about 25 minutes of CPR prior to arrival.  Level 5 caveat secondary to unresponsive.  The history is provided by the EMS personnel.  Cardiac Arrest  Witnessed by:  Family member Incident location:  Home Condition upon EMS arrival:  Unresponsive Pulse:  Absent Initial cardiac rhythm per EMS:  Asystole Treatments prior to arrival:  ACLS protocol and intubation Medications given prior to ED:  Epinephrine Airway:  Bag valve mask and combitube Rhythm on admission to ED:  Asystole Associated symptoms: loss of consciousness   Risk factors: no trauma     Past Medical History:  Diagnosis Date  . Alzheimer disease   . Alzheimer's disease   . Arthritis   . CKD (chronic kidney disease)   . Duodenal ulcer   . Esophageal stricture    daughter unaware of this diagnosis; denies patient has troule with swallowing and is not aware of the EGD done in 2016   . Essential hypertension   . GERD (gastroesophageal reflux disease)   . Hyperlipidemia   . Iron deficiency anemia   . Rib fractures 07/15/2017   seen in ED, see rib XR in epic ; post fall; displaced rib fracture to #5,6,7,8 right side    . Ureteral stone    left ureterovesical junction stone      Patient Active Problem List   Diagnosis Date Noted  . Chest pain 01/14/2016  . Syncope 01/14/2016  . AKI (acute kidney injury) ([place]) 01/14/2016  . Dehydration 01/14/2016  . Anemia 01/14/2016    Past Surgical History:  Procedure Laterality Date  . ABDOMINAL SURGERY    . CATARACT EXTRACTION W/PHACO Right 05/17/2015   Procedure: CATARACT EXTRACTION PHACO AND INTRAOCULAR LENS PLACEMENT (IOC);  Surgeon: Rutherford Guys, MD;  Location: [facility];  Service: Ophthalmology;  Laterality: Right;  CDE:8.33  . CATARACT EXTRACTION W/PHACO Left 05/31/2015   Procedure: CATARACT EXTRACTION PHACO AND INTRAOCULAR LENS PLACEMENT (IOC);  Surgeon: Rutherford Guys, MD;  Location: [facility];  Service: Ophthalmology;  Laterality: Left;  CDE:8.80  . COLONOSCOPY N/A 10/28/2015   Procedure: COLONOSCOPY;  Surgeon: Rogene Houston, MD;  Location: [facility];  Service: Endoscopy;  Laterality: N/A;  730  . CYSTOSCOPY WITH RETROGRADE PYELOGRAM, URETEROSCOPY AND STENT PLACEMENT Left 02/18/2018   Procedure: CYSTOSCOPY WITH LEFT   UNROOF URETEROCELE LEFT URETEROSCOPY, LITHALOPAXY  WITH  LASER;  Surgeon: Irine Seal, MD;  Location: [facility];  Service: Urology;  Laterality: Left;  . DILATION AND CURETTAGE OF UTERUS    . ESOPHAGOGASTRODUODENOSCOPY N/A 10/28/2015   Procedure: ESOPHAGOGASTRODUODENOSCOPY (EGD);  Surgeon: Rogene Houston, MD;  Location: [facility]  END[facility] SU[facility];  Service: Endoscopy;  Laterality: N/A;  . HOLMIUM LASER APPLICATION Left 02/19/3535   Procedure: HOLMIUM LASER APPLICATION;  Surgeon: Irine Seal, MD;  Location: [facility];  Service: Urology;  Laterality: Left;  . ORTHOPEDIC SURGERY Right    Fractured ankle     OB History   None      Home Medications    Prior to Admission medications   Medication Sig Start Date End Date Taking? Authorizing Provider  cephALEXin (KEFLEX) 250 MG capsule Take 250 mg by mouth 3 (three) times daily. FOR 7 DAYS    [provider]  diclofenac sodium (VOLTAREN) 1 % GEL Apply 2 g topically 4 (four) times daily.    [provider]  diltiazem (CARDIZEM CD) 180 MG 24 hr capsule Take 1 capsule (180 mg total) by mouth daily. 01/17/16   Asencion Noble, MD  donepezil (ARICEPT) 10 MG tablet Take 10 mg by mouth at bedtime.    [provider]  HYDROcodone-acetaminophen (NORCO/VICODIN) 5-325 MG tablet Take 1 tablet by mouth every 6 (six) hours as needed for moderate pain. 02/18/18   Irine Seal, MD  losartan-hydrochlorothiazide (HYZAAR) 100-25 MG per tablet Take 1 tablet by mouth daily.    [provider]  pantoprazole (PROTONIX) 40 MG tablet TAKE 1 TABLET BY MOUTH DAILY BEFORE BREAKFAST. 11/04/17   [name], Mechele Dawley, MD  potassium chloride SA (K-DUR,KLOR-CON) 20 MEQ tablet Take 20 mEq by mouth daily.    [provider]    Family History Family History  Problem Relation Age of Onset  . Heart attack Mother   . Hypertension Mother   . Cancer Brother     Social History Social History   Tobacco Use  . Smoking status: Former Smoker    Packs/day: 1.00    Years: 40.00    Pack years: 40.00    Types: Cigarettes    Last attempt to quit: 11/22/2015    Years since quitting: 2.5  . Smokeless tobacco: Never Used  Substance Use Topics  . Alcohol use: No  . Drug use: No     Allergies   Patient has no known allergies.   Review of Systems Review of Systems  Unable to perform ROS: Patient unresponsive  Neurological: Positive for loss of consciousness.     Physical Exam Updated Vital Signs There were no vitals taken for this visit.  Physical Exam  Constitutional: She appears well-developed and well-nourished.  HENT:  Head: Normocephalic and atraumatic.  Patient is edentulous.  Has some secretions in the airway.  Eyes:  Pupils are mid and fixed.  Neck:  Trach midline.  Cardiovascular:  No heart sounds no pulses.  Pulmonary/Chest:  Rhonchorous breath sounds by ambulance.  Abdominal:    Soft no masses  Musculoskeletal:  I/O left lower extremity.  Swelling left lower extremity.  No obvious cords.  No gross deformities.  Neurological:  Patient unresponsive no corneals no withdraw to pain.  Skin:  Cool dry  Nursing note and vitals reviewed.    ED Treatments / Results  Labs (all labs ordered are listed, but only abnormal results are displayed) Labs Reviewed - No data to display  EKG None  Radiology No results found.  Procedures Procedures (including critical care time)  Medications Ordered in ED Medications - No data to display   Initial Impression / Assessment and Plan / ED Course  I have reviewed the triage vital signs and the nursing notes.  Pertinent labs & imaging results that were available  during my care of the patient were reviewed by me and considered in my medical decision making (see chart for details).  Clinical Course as of Jun 28 1003  Thu Jun 26, 2018  2044 Patient had a bedside cardiac ultrasound done by me that showed no cardiac activity at all.  CPR was held and there were no pulses.  Due to her advanced age and minimal response to ALS interventions and no cardiac activity I felt that any further actions would be futile.  Code called at 8:35 PM.   [MB]  2044 Patient's daughter here and updated that the patient had passed.  She is stepping out to call other family members.  I do not feel this would be an ME case as there is no evidence of any trauma or concern for foul play.  I have paged [name] PCP.   [MB]  2111 Discussed with [name] patient's primary care doctor.  He states she had pretty advanced dementia and renal cell carcinoma.  He asked that we send that is significant over to his office and he will take care of it.   [MB]    Clinical Course User Index [MB] Hayden Rasmussen, MD      Final Clinical Impressions(s) / ED Diagnoses   Final diagnoses:  Cardiac arrest [facility])    ED Discharge Orders    None       Hayden Rasmussen, MD 06/27/18 1007

## 2018-07-20 NOTE — ED Notes (Signed)
[facility] notified, and pt was declined for organ donation

## 2018-07-20 NOTE — ED Triage Notes (Signed)
Dr Melina Copa confirmed via [place] no cardiac activity.   Time of death 2034/03/04

## 2018-07-20 DEATH — deceased
